# Patient Record
Sex: Female | Born: 1950 | Race: Black or African American | Hispanic: No | Marital: Married | Smoking: Never smoker
Health system: Southern US, Community
[De-identification: ages and names within clinical notes are randomized; demographics above are authoritative.]

## PROBLEM LIST (undated history)

## (undated) DIAGNOSIS — M1990 Unspecified osteoarthritis, unspecified site: Secondary | ICD-10-CM

## (undated) DIAGNOSIS — I2510 Atherosclerotic heart disease of native coronary artery without angina pectoris: Secondary | ICD-10-CM

## (undated) DIAGNOSIS — Q249 Congenital malformation of heart, unspecified: Secondary | ICD-10-CM

## (undated) DIAGNOSIS — Z87442 Personal history of urinary calculi: Secondary | ICD-10-CM

## (undated) DIAGNOSIS — I509 Heart failure, unspecified: Secondary | ICD-10-CM

## (undated) DIAGNOSIS — I10 Essential (primary) hypertension: Secondary | ICD-10-CM

## (undated) DIAGNOSIS — I639 Cerebral infarction, unspecified: Secondary | ICD-10-CM

## (undated) DIAGNOSIS — K5289 Other specified noninfective gastroenteritis and colitis: Secondary | ICD-10-CM

## (undated) DIAGNOSIS — K8689 Other specified diseases of pancreas: Secondary | ICD-10-CM

## (undated) DIAGNOSIS — J45909 Unspecified asthma, uncomplicated: Secondary | ICD-10-CM

## (undated) DIAGNOSIS — M129 Arthropathy, unspecified: Secondary | ICD-10-CM

## (undated) DIAGNOSIS — E119 Type 2 diabetes mellitus without complications: Secondary | ICD-10-CM

## (undated) HISTORY — DX: Personal history of urinary calculi: Z87.442

## (undated) HISTORY — DX: Unspecified asthma, uncomplicated: J45.909

## (undated) HISTORY — PX: TUBAL LIGATION: SHX77

## (undated) HISTORY — DX: Other specified diseases of pancreas: K86.89

## (undated) HISTORY — DX: Essential (primary) hypertension: I10

## (undated) HISTORY — PX: OTHER SURGICAL HISTORY: SHX169

## (undated) HISTORY — DX: Other specified noninfective gastroenteritis and colitis: K52.89

## (undated) HISTORY — DX: Arthropathy, unspecified: M12.9

## (undated) HISTORY — DX: Type 2 diabetes mellitus without complications: E11.9

## (undated) HISTORY — PX: KNEE ARTHROSCOPY: SUR90

---

## 2003-06-19 ENCOUNTER — Emergency Department (HOSPITAL_COMMUNITY): Admission: EM | Admit: 2003-06-19 | Discharge: 2003-06-20 | Payer: Self-pay | Admitting: Emergency Medicine

## 2004-05-21 ENCOUNTER — Emergency Department (HOSPITAL_COMMUNITY): Admission: EM | Admit: 2004-05-21 | Discharge: 2004-05-21 | Payer: Self-pay | Admitting: Emergency Medicine

## 2005-06-28 ENCOUNTER — Encounter: Admission: RE | Admit: 2005-06-28 | Discharge: 2005-06-28 | Payer: Self-pay | Admitting: *Deleted

## 2005-08-24 ENCOUNTER — Emergency Department (HOSPITAL_COMMUNITY): Admission: EM | Admit: 2005-08-24 | Discharge: 2005-08-24 | Payer: Self-pay | Admitting: Emergency Medicine

## 2005-12-24 ENCOUNTER — Ambulatory Visit: Payer: Self-pay | Admitting: Gastroenterology

## 2005-12-27 ENCOUNTER — Encounter: Payer: Self-pay | Admitting: Gastroenterology

## 2005-12-27 ENCOUNTER — Ambulatory Visit: Payer: Self-pay | Admitting: Gastroenterology

## 2006-01-10 ENCOUNTER — Ambulatory Visit: Payer: Self-pay | Admitting: Gastroenterology

## 2006-01-11 ENCOUNTER — Ambulatory Visit: Payer: Self-pay | Admitting: Cardiovascular Disease

## 2006-01-29 ENCOUNTER — Ambulatory Visit: Payer: Self-pay | Admitting: Gastroenterology

## 2006-02-20 ENCOUNTER — Ambulatory Visit: Payer: Self-pay | Admitting: Gastroenterology

## 2006-03-18 ENCOUNTER — Ambulatory Visit: Payer: Self-pay | Admitting: Gastroenterology

## 2006-03-21 ENCOUNTER — Encounter: Admission: RE | Admit: 2006-03-21 | Discharge: 2006-03-21 | Payer: Self-pay | Admitting: Gastroenterology

## 2006-03-29 ENCOUNTER — Ambulatory Visit: Payer: Self-pay | Admitting: Gastroenterology

## 2006-06-05 ENCOUNTER — Ambulatory Visit: Payer: Self-pay | Admitting: Gastroenterology

## 2006-06-05 LAB — CONVERTED CEMR LAB
ALT: 41 units/L — ABNORMAL HIGH (ref 0–40)
AST: 30 units/L (ref 0–37)
Albumin: 3.6 g/dL (ref 3.5–5.2)
BUN: 7 mg/dL (ref 6–23)
Basophils Relative: 0.7 % (ref 0.0–1.0)
Creatinine, Ser: 0.6 mg/dL (ref 0.4–1.2)
Eosinophils Relative: 9.4 % — ABNORMAL HIGH (ref 0.0–5.0)
GFR calc non Af Amer: 110 mL/min
Glucose, Bld: 111 mg/dL — ABNORMAL HIGH (ref 70–99)
INR: 1.1 (ref 0.9–2.0)
Lymphocytes Relative: 24 % (ref 12.0–46.0)
MCHC: 33.1 g/dL (ref 30.0–36.0)
MCV: 84.5 fL (ref 78.0–100.0)
Monocytes Absolute: 0.5 10*3/uL (ref 0.2–0.7)
Neutro Abs: 3.6 10*3/uL (ref 1.4–7.7)
Neutrophils Relative %: 58.4 % (ref 43.0–77.0)
RDW: 14.3 % (ref 11.5–14.6)
Total Protein: 7.6 g/dL (ref 6.0–8.3)

## 2006-06-06 ENCOUNTER — Ambulatory Visit: Payer: Self-pay | Admitting: Gastroenterology

## 2006-06-06 ENCOUNTER — Encounter (INDEPENDENT_AMBULATORY_CARE_PROVIDER_SITE_OTHER): Payer: Self-pay | Admitting: Specialist

## 2006-06-24 ENCOUNTER — Ambulatory Visit (HOSPITAL_COMMUNITY): Admission: RE | Admit: 2006-06-24 | Discharge: 2006-06-24 | Payer: Self-pay | Admitting: Gastroenterology

## 2006-07-08 ENCOUNTER — Ambulatory Visit: Payer: Self-pay | Admitting: Gastroenterology

## 2006-07-08 LAB — CONVERTED CEMR LAB: Hgb A1c MFr Bld: 7.3 % — ABNORMAL HIGH (ref 4.6–6.0)

## 2006-12-06 ENCOUNTER — Ambulatory Visit: Payer: Self-pay | Admitting: Gastroenterology

## 2007-07-07 DIAGNOSIS — J45909 Unspecified asthma, uncomplicated: Secondary | ICD-10-CM | POA: Insufficient documentation

## 2007-07-07 DIAGNOSIS — E119 Type 2 diabetes mellitus without complications: Secondary | ICD-10-CM | POA: Insufficient documentation

## 2007-07-07 DIAGNOSIS — Z87442 Personal history of urinary calculi: Secondary | ICD-10-CM | POA: Insufficient documentation

## 2007-07-07 DIAGNOSIS — M129 Arthropathy, unspecified: Secondary | ICD-10-CM | POA: Insufficient documentation

## 2007-07-07 DIAGNOSIS — I10 Essential (primary) hypertension: Secondary | ICD-10-CM | POA: Insufficient documentation

## 2007-07-07 DIAGNOSIS — K8689 Other specified diseases of pancreas: Secondary | ICD-10-CM | POA: Insufficient documentation

## 2007-11-13 ENCOUNTER — Telehealth: Payer: Self-pay | Admitting: Gastroenterology

## 2007-11-25 ENCOUNTER — Encounter: Payer: Self-pay | Admitting: Gastroenterology

## 2007-11-27 ENCOUNTER — Ambulatory Visit: Payer: Self-pay | Admitting: Gastroenterology

## 2008-05-21 ENCOUNTER — Encounter: Admission: RE | Admit: 2008-05-21 | Discharge: 2008-05-21 | Payer: Self-pay | Admitting: Internal Medicine

## 2008-12-07 ENCOUNTER — Telehealth: Payer: Self-pay | Admitting: Gastroenterology

## 2009-04-26 ENCOUNTER — Emergency Department (HOSPITAL_COMMUNITY): Admission: EM | Admit: 2009-04-26 | Discharge: 2009-04-26 | Payer: Self-pay | Admitting: Emergency Medicine

## 2009-06-20 ENCOUNTER — Encounter: Payer: Self-pay | Admitting: Gastroenterology

## 2009-06-21 ENCOUNTER — Telehealth: Payer: Self-pay | Admitting: Gastroenterology

## 2009-06-27 ENCOUNTER — Ambulatory Visit: Payer: Self-pay | Admitting: Gastroenterology

## 2009-06-27 DIAGNOSIS — R197 Diarrhea, unspecified: Secondary | ICD-10-CM | POA: Insufficient documentation

## 2010-04-30 ENCOUNTER — Encounter: Payer: Self-pay | Admitting: *Deleted

## 2010-05-11 NOTE — Progress Notes (Signed)
Summary: Needs OV   Phone Note Call from Patient Call back at [phone]   Caller: Patient Call For: Arlyce Dice Reason for Call: Talk to Nurse Summary of Call: Patient would like to be seen sooner than first available 4-12 do to diarrhea after each meals and per PCP pt having problems with her pancreas.  Initial call taken by: Tawni Levy,  June 21, 2009 9:34 AM  Follow-up for Phone Call        Pt. saw her PCP yesterday, was told to f/u with her GI MD. She has diarrhea 2-3 times weekly. Denies blood, black stools, fever, n/v.   1) See Dr.Amayrany Cafaro on 06-27-09 at 2:30pm 2) Have labs/note from PCP faxed over for above appt. 3) Immodium as needed for diarrhea. 4) Pt. instructed to call back as needed.  Follow-up by: Laureen Ochs LPN,  June 21, 2009 10:12 AM

## 2010-05-11 NOTE — Assessment & Plan Note (Signed)
Summary: DIARRHEA                Vanessa    History of Present Illness Visit Type: Follow-up Visit Primary GI MD: Melvia Heaps MD [facility] Primary Provider: Leodis Rains, MD Chief Complaint: diarrhea History of Present Illness:   Vanessa [name] has returned for reevaluation diarrhea.  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  On a regimen of Protonix and Ultrase her diarrhea has significantly improved.  Over the past month she has noted intermittent episodes of urgency with multiple loose stools.  The quality of her diarrhea is different from that which she has experienced in the past.  There has been no change in medications.  Her blood sugars have been poorly controlled as evidenced by an elevated hemoglobin A1c.  She denies melena or hematochezia.   GI Review of Systems      Denies abdominal pain, acid reflux, belching, bloating, chest pain, dysphagia with liquids, dysphagia with solids, heartburn, loss of appetite, nausea, vomiting, vomiting blood, weight loss, and  weight gain.      Reports diarrhea.     Denies anal fissure, black tarry stools, change in bowel habit, constipation, diverticulosis, fecal incontinence, heme positive stool, hemorrhoids, irritable bowel syndrome, jaundice, light color stool, liver problems, rectal bleeding, and  rectal pain. Preventive Screening-Counseling & Management  Alcohol-Tobacco     Smoking Status: quit      Drug Use:  no.      Current Medications (verified): 1)  Multivitamins   Tabs (Multiple Vitamin) .... One Tablet By Mouth Every Day 2)  Enalapril Maleate 20 Mg  Tabs (Enalapril Maleate) .... One Tablet By Mouth Every Day 3)  Hydrochlorothiazide 25 Mg  Tabs (Hydrochlorothiazide) .... One Tablet By Mouth Every Day 4)  Lipitor 10 Mg  Tabs (Atorvastatin Calcium) .... Take 1 Tablet By Mouth Once A Day 5)  Ultrase Mt 20 65-20-65 Mu  Cpep (Amylase-Lipase-Protease) .... Two Tablets With Every Meal 6)  Protonix 40 Mg  Tbec  (Pantoprazole Sodium) .... One Tablet Every Am Before Breakfast 7)  Lexapro 10 Mg  Tabs (Escitalopram Oxalate) .... One Tablet By Mouth Every Day 8)  Metformin Hcl 1000 Mg  Tabs (Metformin Hcl) .... One Tablet By Mouth Twice A Day 9)  Adult Aspirin Ec Low Strength 81 Mg  Tbec (Aspirin) .... Take 2 Tablets By Mouth Once Daily  Allergies (verified): 1)  ! Dilantin  Past History:  Past Medical History: Reviewed history from 07/07/2007 and no changes required. Current Problems:  COLITIS (ICD-558.9) NEPHROLITHIASIS, HX OF (ICD-V13.01) ARTHRITIS (ICD-716.90) ASTHMA (ICD-493.90) HYPERTENSION (ICD-401.9) DM (ICD-250.00) PANCREATIC INSUFFICIENCY (ICD-577.8)  Past Surgical History: Reviewed history from 11/27/2007 and no changes required. Tubal Ligation Knee Arthroscopy left arm fracture  Family History: Reviewed history from 11/27/2007 and no changes required. No FH of Colon Cancer: Family History of Diabetes: brother, sister, father, pat. aunt Family History of Heart Disease: Pat. Aunt  Social History: Reviewed history from 11/27/2007 and no changes required. Occupation: retired Married, 1 girl Patient is a former smoker.  Alcohol Use - no Illicit Drug Use - no Smoking Status:  quit  Review of Systems       The patient complains of fatigue.  The patient denies allergy/sinus, anemia, anxiety-new, arthritis/joint pain, back pain, blood in urine, breast changes/lumps, change in vision, confusion, cough, coughing up blood, depression-new, fainting, fever, headaches-new, hearing problems, heart murmur, heart rhythm changes, itching, muscle pains/cramps, night sweats, nosebleeds, shortness of breath, skin rash, sleeping problems, sore throat, swelling  of feet/legs, swollen lymph glands, thirst - excessive, urination - excessive, urination changes/pain, urine leakage, vision changes, and voice change.    Vital Signs:  Patient profile:   60 year old female Height:      63.5  inches Weight:      253.50 pounds BMI:     44.36 Pulse rate:   84 / minute Pulse rhythm:   regular BP sitting:   122 / 78  (left arm)  Vitals Entered By: Milford Cage NCMA (June 27, 2009 2:26 PM)  Physical Exam  Additional Exam:  On physical exam she is a well-developed well-nourished female  skin: anicteric HEENT: normocephalic; PEERLA; no nasal or pharyngeal abnormalities neck: supple nodes: no cervical lymphadenopathy chest: clear to ausculatation and percussion heart: no murmurs, gallops, or rubs abd: soft, nontender; BS normoactive; no abdominal masses, tenderness, organomegaly rectal: deferred ext: no cynanosis, clubbing, edema skeletal: no deformities neuro: oriented x 3; no focal abnormalities    Impression & Recommendations:  Problem # 1:  DIARRHEA (ICD-787.91) While she has maldigestion secondary to pancreatic insufficiency, this problem has been properly and successfully addressed.  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  Recommendations #1 trial of ampicillin 2050 mg q.i.d. for 7 days  Problem # 2:  DM (ICD-250.00) Assessment: Comment Only  Patient Instructions: 1)  We will send in your prescription to your pharmacy 2)  The medication list was reviewed and reconciled.  All changed / newly prescribed medications were explained.  A complete medication list was provided to the patient / caregiver. 3)  Please schedule a follow-up appointment in 3 weeks.  4)  cc Leodis Rains, MD Prescriptions: AMPICILLIN 250 MG/5ML SUSR (AMPICILLIN) take one tablet q.i.d.  #28 x 3   Entered and Authorized by:   Louis Meckel MD   Signed by:   Louis Meckel MD on 06/27/2009   Method used:   Electronically to        [facility]  [address] [id number] ([facility]       [address]       [place], [place]  [address]       Ph: [phone]       Fax: [phone]   RxID:    [id number]   Appended Document: DIARRHEA                Vanessa    Clinical Lists Changes  Medications: Rx of AMPICILLIN 250 MG/5ML SUSR (AMPICILLIN) take one tablet q.i.d.;  #28 x 3;  Signed;  Entered by: Merri Ray CMA (AAMA);  Authorized by: Louis Meckel MD;  Method used: Print then Give to Patient    Prescriptions: AMPICILLIN 250 MG/5ML SUSR (AMPICILLIN) take one tablet q.i.d.  #28 x 3   Entered by:   Merri Ray CMA (AAMA)   Authorized by:   Louis Meckel MD   Signed by:   Merri Ray CMA (AAMA) on 06/27/2009   Method used:   Print then Give to Patient   RxID:   [id number]

## 2010-06-26 LAB — URINALYSIS, ROUTINE W REFLEX MICROSCOPIC
Hgb urine dipstick: NEGATIVE
Ketones, ur: NEGATIVE mg/dL
Protein, ur: NEGATIVE mg/dL
Urobilinogen, UA: 1 mg/dL (ref 0.0–1.0)
pH: 6 (ref 5.0–8.0)

## 2010-06-26 LAB — DIFFERENTIAL
Basophils Absolute: 0 10*3/uL (ref 0.0–0.1)
Basophils Relative: 0 % (ref 0–1)
Eosinophils Absolute: 0.4 10*3/uL (ref 0.0–0.7)
Eosinophils Relative: 4 % (ref 0–5)
Neutrophils Relative %: 71 % (ref 43–77)

## 2010-06-26 LAB — GLUCOSE, CAPILLARY: Glucose-Capillary: 160 mg/dL — ABNORMAL HIGH (ref 70–99)

## 2010-06-26 LAB — COMPREHENSIVE METABOLIC PANEL
ALT: 28 U/L (ref 0–35)
AST: 25 U/L (ref 0–37)
Albumin: 3.8 g/dL (ref 3.5–5.2)
Alkaline Phosphatase: 102 U/L (ref 39–117)
CO2: 31 mEq/L (ref 19–32)
Calcium: 9.7 mg/dL (ref 8.4–10.5)
Chloride: 100 mEq/L (ref 96–112)
Creatinine, Ser: 0.74 mg/dL (ref 0.4–1.2)
GFR calc Af Amer: >60 mL/min (ref >60)
GFR calc non Af Amer: >60 mL/min (ref >60)
Glucose, Bld: 142 mg/dL — ABNORMAL HIGH (ref 70–99)
Potassium: 3.8 mEq/L (ref 3.5–5.1)
Total Protein: 7.7 g/dL (ref 6.0–8.3)

## 2010-06-26 LAB — CBC
HCT: 37.4 % (ref 36.0–46.0)
Hemoglobin: 12 g/dL (ref 12.0–15.0)
MCV: 84.7 fL (ref 78.0–100.0)
WBC: 8.7 10*3/uL (ref 4.0–10.5)

## 2010-06-26 LAB — ACETAMINOPHEN LEVEL: Acetaminophen (Tylenol), Serum: <10 ug/mL — ABNORMAL LOW (ref 10–30)

## 2010-06-26 LAB — URINE MICROSCOPIC-ADD ON

## 2010-08-22 NOTE — Assessment & Plan Note (Signed)
[facility]                         GASTROENTEROLOGY OFFICE NOTE   NAME:Vanessa [name], Vanessa [name]                      MRN:          [id number]  DATE:12/06/2006                            DOB:          [birth year]    PROBLEM:  Pancreatic insufficiency.   HISTORY OF PRESENT ILLNESS:  Ms. Rottinghaus has returned for scheduled  followup. On the regimen of pancreatic enzyme supplementation and  Protonix, she is doing extremely well. She has pancreatic insufficiency  of unclear etiology. Because of the slowly elevated glucose, a  hemoglobin A1C was drawn and that was elevated to 7.3. She was placed on  Metformin. If she does have a particularly greasy or fatty meal, she  develops some grumbling, which subsides if she takes an extra enzyme  tablet.   PHYSICAL EXAMINATION:  VITAL SIGNS:  Pulse 80, blood pressure 120/76,  weight 242.   IMPRESSION:  1. Pancreatic insufficiency of unclear etiology.  2. Diabetes.   RECOMMENDATIONS:  Continue current regimen.     Barbette Hair. Arlyce Dice, MD,FACG  Electronically Signed    RDK/MedQ  DD: 12/06/2006  DT: 12/07/2006  Job #: [id number]

## 2010-08-25 NOTE — Assessment & Plan Note (Signed)
[facility]                         GASTROENTEROLOGY OFFICE NOTE   NAME:Vanessa [name], Vanessa [name]                      MRN:          [id number]  DATE:06/05/2006                            DOB:          February 26, 1951    PROBLEM:  Diarrhea.   REASON:  Vanessa [name] has returned for reevaluation.  She was seen at  [facility].  It was felt that she has severe IBS.  She was told to stop  her pancreatic enzyme supplements because of diarrhea.  She stopped this  2-1/2 weeks ago and has developed severe diarrhea with 5 to 6 stools a  day.  She is complaining of fatigue.  She has greasy stools.   EXAMINATION:  Pulse 64, blood pressure 120/68, weight 244.  ABDOMEN:  Is mildly diffusely tender without guarding or rebound. There  are no abdominal masses or organomegaly.   IMPRESSION:  Severe diarrhea.  It appears that she has been  malabsorbing, as evidenced by her greasy stools.  Pancreatic  insufficiency is certainly a possibility, though she has no risk factors  for developing pancreatic insufficiency.  Probably malabsorption, small  bowel malabsorption is a concern, though her celiac markers were  negative.  I question the diagnosis of irritable bowel syndrome in the  face of positive fecal fat.   RECOMMENDATIONS:  1. Repeat CBC, carotene level and CMET.  2. Resume pancreatic enzymes.  3. Small bowel biopsy.  4. Repeat stool [place] stain.  5. To consider MRCP if appears to be pancreatic insufficiency to      determine whether she has any ductal abnormalities.     Barbette Hair. Arlyce Dice, MD,FACG  Electronically Signed    RDK/MedQ  DD: 06/05/2006  DT: 06/05/2006  Job #: [id number]   cc:   Danella Deis, MD

## 2010-08-25 NOTE — Assessment & Plan Note (Signed)
[facility]                         GASTROENTEROLOGY OFFICE NOTE   NAME:Hehr, Vanessa [name]                      MRN:          [id number]  DATE:03/18/2006                            DOB:          25-Jun-1950    PROBLEM:  Diarrhea.   REASON:  Vanessa [name] has returned for re-evaluation.  She initially  improved on Ultrase supplementation.  She subsequently has developed  recurrent multiple stools.  Stools are solid, though she may have at  least 3-4 a day.  She has the sensation of tenesmus and then will follow  by passing more stool.  Weight has been stable.  She has been taking  Ultrase two with each meal.  She remains on Lialda.   PHYSICAL EXAMINATION:  Pulse 80, blood pressure 122/72, weight 238.   IMPRESSION:  Persistent hyper-stooling with what the patient describes  as grease.  It is not clear what the etiology for this is.  Presumably,  she has some malabsorption or maldigestion.   RECOMMENDATION:  1. Add Protonix 40 mg a day to her Ultrase.  2. DC Lialda.  3. CT enteroscopy.  4. If patient is not improved, I will refer her to a tertiary care      center for another opinion.     Barbette Hair. Arlyce Dice, MD,FACG  Electronically Signed    RDK/MedQ  DD: 03/18/2006  DT: 03/19/2006  Job #: [id number]   cc:   Leodis Rains

## 2010-08-25 NOTE — Assessment & Plan Note (Signed)
[facility]                           GASTROENTEROLOGY OFFICE NOTE   NAME:Vanessa [name], Vanessa [name]                      MRN:          [id number]  DATE:01/10/2006                            DOB:          1950-10-05    PROBLEM:  Diarrhea.   Vanessa [name] has returned for scheduled GI follow-up.  Colonoscopy  demonstrated what appeared to be some inflammatory changes in the left colon  consisting of edema and a few areas of granularity.  Biopsies, however, did  not demonstrate any inflammatory changes.  She has been taking Lialda 4.8 g  a day without improvement.  She continues to have multiple bowel movements  during the day and at night.  She feels weak.  She has lost three more  pounds.  The daughter has expressed keen concern about her weight loss and  there is a question of obtaining second opinion.   PHYSICAL EXAMINATION:  VITAL SIGNS:  Pulse 72, blood pressure 138/80, weight  238.   IMPRESSION:  A several month history of diarrhea.  There has been no change  in medications.  Hyperthyroidism should be ruled out.  Chronic infection is  unlikely.  This should be ruled out as well.   RECOMMENDATIONS:  1. Check stools for C. difficile toxin, O&P and C&S and leukocytes.  2. Check a thyroid function tests.  3. CT of the abdomen and pelvis.   I carefully explained to Vanessa [name] and her daughter that they are free  to get a second opinion which I will facilitate.  She does not have a  diagnosis as yet but I explained that work-up will continue.  She is  instructed to continue her Lialda.       Barbette Hair. Arlyce Dice, MD,FACG      RDK/MedQ  DD:  01/10/2006  DT:  01/11/2006  Job #:  [id number]   cc:   Leodis Rains, MD

## 2010-08-25 NOTE — Assessment & Plan Note (Signed)
[facility]                           GASTROENTEROLOGY OFFICE NOTE   NAME:Vanessa [name], Vanessa [name]                      MRN:          [id number]  DATE:02/20/2006                            DOB:          03-29-1951    PROBLEM:  Diarrhea.   Ms. Traore has returned for scheduled followup. Lab work including stools  for C&S, O&P, leukocytes and C Dif toxin were negative. CT of the abdomen  and pelvis were also unremarkable. While a stool _________  was negative, a  random of quantitative fecal fat was positive at 40 with normal being less  than 20%. Betacarotene level was normal as was a celiac panel. Ms. Summerfield  continues to complain of 4-5 stools daily that are greasy.   PHYSICAL EXAMINATION:  Pulse 60, blood pressure 110/76, weight 235.   IMPRESSION:  Diarrhea. She appears to be malabsorbing or maldigesting. There  is no evidence of underlying pancreatic disease, however.   RECOMMENDATIONS:  A trial of pancreatic enzyme supplementation. If this is  negative, I will obtain small bowel biopsies.     Barbette Hair. Arlyce Dice, MD,FACG  Electronically Signed    RDK/MedQ  DD: 02/20/2006  DT: 02/20/2006  Job #: [id number]   cc:   Leodis Rains, MD

## 2010-08-25 NOTE — Assessment & Plan Note (Signed)
[name]                                   ON-CALL NOTE   NAME:[name], [name]                      MRN:          [id number]  DATE:12/27/2005                            DOB:          1950/09/17    CALLER:  Vanessa [name], the patient's daughter.   DATE OF THE CALL:  December 27, 2005, 5:21 p.m.   REASON FOR THE CALL:  Medication question.   TELEPHONE CONVERSATION:  Patient's daughter called today stating that her  mother had had a colonoscopy with Dr. Arlyce Dice earlier today.  She was  prescribed Lialda for newly diagnosed colitis.  She had been having problems  with diarrhea.  However, the cost of the prescription was found to be  prohibitive and they inquired regarding therapeutic substitution.  I  informed them that they can discuss this with Dr. Arlyce Dice tomorrow during  office hours to have him provide more cost effective alternative therapy, as  she is in no acute distress short of her chronic diarrhea.  They did inquire  if there was anything she could take tonight for diarrhea if it were to  become problematic.  I told them that over-the-counter Imodium would be fine  in limited doses, as directed, until they speak to Dr. Arlyce Dice.                                   [name], MD   JNP/MedQ  DD:  12/27/2005  DT:  12/29/2005  Job #:  [id number]   cc:   Barbette Hair. Arlyce Dice, MD,FACG

## 2010-08-25 NOTE — Assessment & Plan Note (Signed)
[facility]                         GASTROENTEROLOGY OFFICE NOTE   NAME:[name], Vanessa WELLIVER                      MRN:          [id number]  DATE:03/29/2006                            DOB:          [birth year]    PROBLEM:  Diarrhea.  [name] has returned for scheduled followup.  On a regimen of Protonix and Altace, the latter 2 tabs with each meal.  Her diarrhea has entirely subsided.  She complains of slight bloating  only.  Stools are solid.  She is no longer passing grease.  Weight is  stable.   Pulse 68, blood pressure 102/64, weight 242.   IMPRESSION:  Diarrhea with a positive fecal fat.  Presumably, this is  due to a pancreatic insufficiency.  Celiac panel is negative.  Recent  small bowel CT enterography was normal.  CT scan of the abdomen and  pelvis was unremarkable as well.  I believe that she probably is now  digesting, I am not certain why she would have pancreatic insufficiency.  There is no history of alcohol abuse or chronic pancreatitis, but she is  responding to pancreatic enzymes supplementation.   RECOMMENDATIONS:  1. Continue current regimen.  2. Patient wishes to obtain a second opinion, and she will be seen at      [facility].     Barbette Hair. Arlyce Dice, MD,FACG  Electronically Signed    RDK/MedQ  DD: 03/29/2006  DT: 03/30/2006  Job #: [id number]   cc:   Dr. Danella Deis

## 2010-08-25 NOTE — Assessment & Plan Note (Signed)
[facility]                         GASTROENTEROLOGY OFFICE NOTE   NAME:Vanessa [name]                      MRN:          [id number]  DATE:07/08/2006                            DOB:          12/31/1950    PROBLEM:  Malabsorption.   REASON:  [name] has returned for a scheduled followup.  She has  pancreatic insufficiency determined by a malabsorption workup.  Her  carotene level was low.  Small bowel biopsy was negative for sprue.  Off  pancreatic enzymes she continued to have excess free fatty acids.   She is back on enzyme replacement and diarrhea has subsided.  She does  have about 3 bowel movements a day, though they are solid and without  urgency.  She is no longer passing grease and her energy level is  significantly improved.  An MRCP demonstrated no ductal abnormalities  and some mild pancreatic atrophy.   On exam pulse 64, blood pressure 110/68, weight 253.   IMPRESSION:  Pancreatic insufficiency of unclear etiology.  She has  responded well to enzyme supplementation, combined with proton pump  inhibitor therapy.   RECOMMENDATIONS:  1. Continue current medications.  2. Check hemoglobin A1C to rule out concurrent diabetes (minimal      elevations of  her glucose in the past).     Barbette Hair. Arlyce Dice, MD,FACG  Electronically Signed    RDK/MedQ  DD: 07/08/2006  DT: 07/08/2006  Job #: [id number]

## 2010-08-25 NOTE — Assessment & Plan Note (Signed)
[facility]                           GASTROENTEROLOGY OFFICE NOTE   NAME:[name], Vanessa ALFIERI                      MRN:          [id number]  DATE:12/24/2005                            DOB:          1950-05-21    REASON FOR CONSULTATION:  Diarrhea.   Vanessa [name] is a pleasant 60 year old African American female referred  through the courtesy of Dr. Chaney Born for evaluation. Over the last  three months, she has been complaining of significant change of bowel  habits.  She is having up to 10 bowel movements a day consisting of loose  watery stools.  They are accompanied by urgency and occasional incontinence.  She denies abdominal pain.  She occasionally wakens with pain with the urge  to defecate.  There has been no change in her diet or medications.  She has  been on no antibiotics.  She has lost 28 pounds since March 2007.   PAST MEDICAL HISTORY:  1. Hypertension.  2. Asthma.  3. Arthritis.  4. History of kidney stones.  5. Status post tubal ligation.   FAMILY HISTORY:  Noncontributory.   MEDICATIONS:  1. Enalapril.  2. Hydrochlorothiazide.   ALLERGIES:  She is allergic to DILANTIN.   She neither smokes nor drinks.  She is married and retired.   REVIEW OF SYSTEMS:  Positive for joint and back pain.   PHYSICAL EXAMINATION:  GENERAL APPEARANCE:  She is a healthy-appearing  female.  VITAL SIGNS:  Pulse 84, blood pressure 130/82, weight 241.  HEENT:  EOMI. PERRLA. Sclerae are anicteric.  Conjunctivae are pink.  NECK:  Supple without thyromegaly, adenopathy or carotid bruits.  CHEST:  Clear to auscultation and percussion without adventitious sounds.  CARDIAC:  Regular rhythm; normal S1 S2.  There are no murmurs, gallops or  rubs.  ABDOMEN:  Bowel sounds are normoactive.  Abdomen is soft, non-tender and non-  distended.  There are no abdominal masses, tenderness, splenic enlargement  or hepatomegaly.  EXTREMITIES:  Full range of motion.   No cyanosis, clubbing or edema.  RECTAL:  Examination deferred.   IMPRESSION:  Change of bowel habits with new onset diarrhea and weight loss.  Obstructive lesion of the colon should be ruled out including neoplasm and  inflammatory bowel disease.  Infection is less likely.  Medications are also  not likely in the absence of any change.   RECOMMENDATION:  1. CBC, CMET and sed rate.  2. Colonoscopy.                                   Barbette Hair. Arlyce Dice, MD,FACG   RDK/MedQ  DD:  12/24/2005  DT:  12/25/2005  Job #:  [id number]   cc:   Dr. Chaney Born

## 2010-12-27 ENCOUNTER — Encounter: Payer: Self-pay | Admitting: Gastroenterology

## 2010-12-27 ENCOUNTER — Other Ambulatory Visit (INDEPENDENT_AMBULATORY_CARE_PROVIDER_SITE_OTHER)

## 2010-12-27 ENCOUNTER — Ambulatory Visit (INDEPENDENT_AMBULATORY_CARE_PROVIDER_SITE_OTHER): Admitting: Gastroenterology

## 2010-12-27 VITALS — BP 138/80 | HR 73 | Ht 61.0 in | Wt 226.0 lb

## 2010-12-27 DIAGNOSIS — R634 Abnormal weight loss: Secondary | ICD-10-CM

## 2010-12-27 DIAGNOSIS — K8689 Other specified diseases of pancreas: Secondary | ICD-10-CM

## 2010-12-27 DIAGNOSIS — R110 Nausea: Secondary | ICD-10-CM

## 2010-12-27 LAB — CBC WITH DIFFERENTIAL/PLATELET
Basophils Absolute: 0 10*3/uL (ref 0.0–0.1)
Eosinophils Absolute: 0.3 10*3/uL (ref 0.0–0.7)
HCT: 37.9 % (ref 36.0–46.0)
Hemoglobin: 12.4 g/dL (ref 12.0–15.0)
Lymphs Abs: 1.8 10*3/uL (ref 0.7–4.0)
MCHC: 32.7 g/dL (ref 30.0–36.0)
MCV: 84.6 fl (ref 78.0–100.0)
Monocytes Absolute: 0.5 10*3/uL (ref 0.1–1.0)
Neutro Abs: 4.5 10*3/uL (ref 1.4–7.7)
Platelets: 515 10*3/uL — ABNORMAL HIGH (ref 150.0–400.0)
RDW: 15.2 % — ABNORMAL HIGH (ref 11.5–14.6)

## 2010-12-27 LAB — COMPREHENSIVE METABOLIC PANEL
AST: 23 U/L (ref 0–37)
Albumin: 4 g/dL (ref 3.5–5.2)
Alkaline Phosphatase: 88 U/L (ref 39–117)
BUN: 12 mg/dL (ref 6–23)
Creatinine, Ser: 0.6 mg/dL (ref 0.4–1.2)
Glucose, Bld: 149 mg/dL — ABNORMAL HIGH (ref 70–99)
Potassium: 3.9 mEq/L (ref 3.5–5.1)
Total Bilirubin: 0.4 mg/dL (ref 0.3–1.2)

## 2010-12-27 LAB — PROTIME-INR: Prothrombin Time: 12.2 s (ref 10.2–12.4)

## 2010-12-27 NOTE — Progress Notes (Signed)
History of Present Illness:  Ms. Pardue has returned for evaluation of weight loss. She has a history of diabetes and idiopathic pancreatic insufficiency, Colonoscopy in 2007 showed mild erythema but biopsies were negative. Upper endoscopy in 2008 was normal. Biopsies were negative for celiac disease.  She recently has had a 50 pound weight loss and changes in her insulin and oral hypoglycemics because of difficulty with glucose control. Bowels are fairly stable. She denies oily or loose stools. Family is concerned that she may have worsening pancreatic insufficiency. She  complain of abdominal bloating and occasional nausea.    Review of Systems: Pertinent positive and negative review of systems were noted in the above HPI section. All other review of systems were otherwise negative.    Current Medications, Allergies, Past Medical History, Past Surgical History, Family History and Social History were reviewed in [employer] electronic medical record  Vital signs were reviewed in today's medical record. Physical Exam: General: Well developed , well nourished, no acute distress Head: Normocephalic and atraumatic Eyes:  sclerae anicteric, EOMI Ears: Normal auditory acuity Mouth: No deformity or lesions Lungs: Clear throughout to auscultation Heart: Regular rate and rhythm; no murmurs, rubs or bruits Abdomen: Soft, non tender and non distended. No masses, hepatosplenomegaly or hernias noted. Normal Bowel sounds Rectal:deferred Musculoskeletal: Symmetrical with no gross deformities  Pulses:  Normal pulses noted Extremities: No clubbing, cyanosis, edema or deformities noted Neurological: Alert oriented x 4, grossly nonfocal Psychological:  Alert and cooperative. Normal mood and affect

## 2010-12-27 NOTE — Assessment & Plan Note (Addendum)
Change in insulin requirements and weight loss raises the question of worsening pancreatic endocrine insufficiency. There is no clinical evidence for worsening pancreatic exocrine insufficiency.  Recommendations #1 check CBC, INR, complete metabolic profile and carotene levels

## 2010-12-27 NOTE — Assessment & Plan Note (Signed)
Nausea and bloating could be secondary to gastroparesis.  Recommendations #1 gastric emptying scan

## 2010-12-27 NOTE — Patient Instructions (Signed)
Go to the basement for labs today Your GES is scheduled on 01/16/2011 at 8am to arrive at 7:45am at [facility] You will not take your Protonix 24 hours prior to your test

## 2010-12-31 LAB — CAROTENE, SERUM: Carotene, Total-Serum: 8 ug/dL (ref 6–77)

## 2011-01-16 ENCOUNTER — Telehealth: Payer: Self-pay | Admitting: Gastroenterology

## 2011-01-16 ENCOUNTER — Encounter (HOSPITAL_COMMUNITY)
Admission: RE | Admit: 2011-01-16 | Discharge: 2011-01-16 | Disposition: A | Discharge status: Home / Self Care | Source: Ambulatory Visit | Attending: Gastroenterology | Admitting: Gastroenterology

## 2011-01-16 DIAGNOSIS — R634 Abnormal weight loss: Secondary | ICD-10-CM | POA: Insufficient documentation

## 2011-01-16 MED ORDER — TECHNETIUM TC 99M SULFUR COLLOID
2.0000 | Freq: Once | INTRAVENOUS | Status: AC | PRN
  Administered 2011-01-16: 2 via ORAL

## 2011-01-16 NOTE — Telephone Encounter (Signed)
Lab results reviewed with pt.

## 2011-01-17 ENCOUNTER — Other Ambulatory Visit (HOSPITAL_COMMUNITY)

## 2011-01-18 ENCOUNTER — Telehealth: Payer: Self-pay

## 2011-01-18 NOTE — Telephone Encounter (Signed)
Pt aware.

## 2011-01-18 NOTE — Progress Notes (Signed)
Quick Note:  Please inform the patient that her GESwas normal and to continue current plan of action ______

## 2011-01-18 NOTE — Telephone Encounter (Signed)
Message copied by Michele Mcalpine on Thu Jan 18, 2011  9:17 AM ------      Message from: Melvia Heaps D      Created: Thu Jan 18, 2011  8:35 AM       Please inform the patient that her GESwas normal and to continue current plan of action

## 2011-02-14 ENCOUNTER — Other Ambulatory Visit: Payer: Self-pay

## 2011-02-14 ENCOUNTER — Encounter (HOSPITAL_COMMUNITY): Payer: Self-pay | Admitting: Student

## 2011-02-14 ENCOUNTER — Emergency Department (HOSPITAL_COMMUNITY)
Admission: EM | Admit: 2011-02-14 | Discharge: 2011-02-14 | Disposition: A | Discharge status: Home / Self Care | Attending: Emergency Medicine | Admitting: Emergency Medicine

## 2011-02-14 DIAGNOSIS — E119 Type 2 diabetes mellitus without complications: Secondary | ICD-10-CM | POA: Insufficient documentation

## 2011-02-14 DIAGNOSIS — R531 Weakness: Secondary | ICD-10-CM

## 2011-02-14 DIAGNOSIS — R5381 Other malaise: Secondary | ICD-10-CM | POA: Insufficient documentation

## 2011-02-14 DIAGNOSIS — Z794 Long term (current) use of insulin: Secondary | ICD-10-CM | POA: Insufficient documentation

## 2011-02-14 DIAGNOSIS — I10 Essential (primary) hypertension: Secondary | ICD-10-CM | POA: Insufficient documentation

## 2011-02-14 DIAGNOSIS — R5383 Other fatigue: Secondary | ICD-10-CM | POA: Insufficient documentation

## 2011-02-14 LAB — BASIC METABOLIC PANEL
BUN: 13 mg/dL (ref 6–23)
Chloride: 98 mEq/L (ref 96–112)
Creatinine, Ser: 0.6 mg/dL (ref 0.50–1.10)
GFR calc Af Amer: >90 mL/min (ref >90)
GFR calc non Af Amer: >90 mL/min (ref >90)
Potassium: 4.1 mEq/L (ref 3.5–5.1)

## 2011-02-14 LAB — CBC
HCT: 39.5 % (ref 36.0–46.0)
MCHC: 32.9 g/dL (ref 30.0–36.0)
MCV: 83 fL (ref 78.0–100.0)
Platelets: 507 10*3/uL — ABNORMAL HIGH (ref 150–400)
RDW: 15.1 % (ref 11.5–15.5)
WBC: 8 10*3/uL (ref 4.0–10.5)

## 2011-02-14 LAB — URINALYSIS, ROUTINE W REFLEX MICROSCOPIC
Bilirubin Urine: NEGATIVE
Hgb urine dipstick: NEGATIVE
Ketones, ur: NEGATIVE mg/dL
Nitrite: NEGATIVE
Protein, ur: NEGATIVE mg/dL
Specific Gravity, Urine: 1.021 (ref 1.005–1.030)
Urobilinogen, UA: 1 mg/dL (ref 0.0–1.0)

## 2011-02-14 LAB — GLUCOSE, CAPILLARY: Glucose-Capillary: 117 mg/dL — ABNORMAL HIGH (ref 70–99)

## 2011-02-14 LAB — TROPONIN I: Troponin I: <0.3 ng/mL (ref <0.30)

## 2011-02-14 MED ORDER — ZOLPIDEM TARTRATE 5 MG PO TABS: 5.0000 mg | ORAL_TABLET | Freq: Every evening | ORAL | Status: DC | PRN

## 2011-02-14 NOTE — ED Provider Notes (Signed)
History     CSN: [id number] Arrival date & time: 02/14/2011 11:52 AM   First MD Initiated Contact with Patient 02/14/11 1302      Chief Complaint  Patient presents with  . Hyperglycemia    (Consider location/radiation/quality/duration/timing/severity/associated sxs/prior treatment) HPI  Past Medical History  Diagnosis Date  . Other and unspecified noninfectious gastroenteritis and colitis   . Personal history of urinary calculi   . Arthropathy, unspecified, site unspecified   . Unspecified asthma   . Unspecified essential hypertension   . Type II or unspecified type diabetes mellitus without mention of complication, not stated as uncontrolled   . Other specified disease of pancreas     Past Surgical History  Procedure Date  . Tubal ligation   . Knee arthroscopy   . Left arm fracture     Family History  Problem Relation Age of Onset  . Diabetes Father     multiple family members  . Heart disease Father     brother, paternal aunt  . Colon cancer Neg Hx   . Lung cancer Father   . Leukemia Mother     History  Substance Use Topics  . Smoking status: Never Smoker   . Smokeless tobacco: Never Used  . Alcohol Use: No    OB History    Grav Para Term Preterm Abortions TAB SAB Ect Mult Living                  Review of Systems  Allergies  Phenytoin  Home Medications   Current Outpatient Rx  Name Route Sig Dispense Refill  . AMYLASE-LIPASE-PROTEASE 65-20-65 MU PO CPEP Oral Take 1 capsule by mouth 2 (two) times daily with a meal.     . ASPIRIN 81 MG PO TABS Oral Take 81 mg by mouth daily.     . ENALAPRIL MALEATE 20 MG PO TABS Oral Take 20 mg by mouth daily.     Marland Kitchen ESCITALOPRAM OXALATE 10 MG PO TABS Oral Take 10 mg by mouth daily.     Marland Kitchen EVENING PRIMROSE OIL 1000 MG PO CAPS Oral Take 1 capsule by mouth daily.      Marland Kitchen HYDROCHLOROTHIAZIDE 25 MG PO TABS Oral Take 25 mg by mouth daily.     . INSULIN REGULAR HUMAN 100 UNIT/ML IJ SOLN Subcutaneous Inject 30 Units  into the skin daily.     Marland Kitchen METFORMIN HCL 1000 MG PO TABS Oral Take 1,000 mg by mouth 2 (two) times daily with a meal.     . MULTIVITAMINS PO CAPS Oral Take 1 capsule by mouth daily.     Marland Kitchen OVER THE COUNTER MEDICATION Oral Take 1 tablet by mouth daily. DIABETIC VITAMIN PACK ([facility] BRAND)     . PANTOPRAZOLE SODIUM 40 MG PO TBEC Oral Take 40 mg by mouth daily.     Marland Kitchen AMPICILLIN 250 MG PO CAPS Oral Take 250 mg by mouth 4 (four) times daily.     . ATORVASTATIN CALCIUM 10 MG PO TABS Oral Take 10 mg by mouth daily.      Marland Kitchen ZOLPIDEM TARTRATE 5 MG PO TABS Oral Take 1 tablet (5 mg total) by mouth at bedtime as needed for sleep. 10 tablet 0    BP 132/65  Pulse 80  Temp(Src) 98.6 F (37 C) (Oral)  Resp 20  Wt 224 lb (101.606 kg)  SpO2 100%  Physical Exam  ED Course  Procedures (including critical care time)  Labs Reviewed  GLUCOSE, CAPILLARY - Abnormal; Notable for  the following:    Glucose-Capillary 117 (*)    All other components within normal limits  CBC - Abnormal; Notable for the following:    Platelets 507 (*)    All other components within normal limits  BASIC METABOLIC PANEL - Abnormal; Notable for the following:    Glucose, Bld 111 (*)    All other components within normal limits  TROPONIN I  URINALYSIS, ROUTINE W REFLEX MICROSCOPIC  POCT CBG MONITORING   No results found.   1. Weakness       MDM  Well appearing. Generalized weakness. No specific complaint.         Lyanne Co, MD 02/14/11 1640

## 2011-02-14 NOTE — ED Notes (Signed)
Pt in with c/o hyperglycemia this morning. Reports prior hx of intermittent blood sugar highs and lows. Reports taking 1000 mg metformin and 30 unit of Regular insulin this morning. At home, CBG in 180 range.

## 2011-02-14 NOTE — ED Notes (Signed)
Pt gone to the restroom at this time. Will get labs when pt returns.

## 2011-02-19 ENCOUNTER — Emergency Department (HOSPITAL_COMMUNITY)

## 2011-02-19 ENCOUNTER — Encounter (HOSPITAL_COMMUNITY): Payer: Self-pay | Admitting: *Deleted

## 2011-02-19 ENCOUNTER — Other Ambulatory Visit: Payer: Self-pay

## 2011-02-19 ENCOUNTER — Inpatient Hospital Stay (HOSPITAL_COMMUNITY)
Admission: EM | Admit: 2011-02-19 | Discharge: 2011-02-22 | DRG: 066 | Disposition: A | Discharge status: Home Health | Attending: Internal Medicine | Admitting: Internal Medicine

## 2011-02-19 DIAGNOSIS — Z794 Long term (current) use of insulin: Secondary | ICD-10-CM

## 2011-02-19 DIAGNOSIS — I6350 Cerebral infarction due to unspecified occlusion or stenosis of unspecified cerebral artery: Principal | ICD-10-CM | POA: Diagnosis present

## 2011-02-19 DIAGNOSIS — R531 Weakness: Secondary | ICD-10-CM

## 2011-02-19 DIAGNOSIS — I10 Essential (primary) hypertension: Secondary | ICD-10-CM | POA: Diagnosis present

## 2011-02-19 DIAGNOSIS — E119 Type 2 diabetes mellitus without complications: Secondary | ICD-10-CM

## 2011-02-19 DIAGNOSIS — IMO0001 Reserved for inherently not codable concepts without codable children: Secondary | ICD-10-CM | POA: Diagnosis present

## 2011-02-19 DIAGNOSIS — M129 Arthropathy, unspecified: Secondary | ICD-10-CM

## 2011-02-19 DIAGNOSIS — R5381 Other malaise: Secondary | ICD-10-CM | POA: Diagnosis present

## 2011-02-19 DIAGNOSIS — E785 Hyperlipidemia, unspecified: Secondary | ICD-10-CM | POA: Diagnosis present

## 2011-02-19 DIAGNOSIS — R5383 Other fatigue: Secondary | ICD-10-CM | POA: Diagnosis present

## 2011-02-19 LAB — DIFFERENTIAL
Basophils Absolute: 0 10*3/uL (ref 0.0–0.1)
Basophils Relative: 0 % (ref 0–1)
Eosinophils Absolute: 0.5 10*3/uL (ref 0.0–0.7)
Eosinophils Relative: 5 % (ref 0–5)
Monocytes Absolute: 0.5 10*3/uL (ref 0.1–1.0)
Monocytes Relative: 5 % (ref 3–12)
Neutro Abs: 6 10*3/uL (ref 1.7–7.7)

## 2011-02-19 LAB — COMPREHENSIVE METABOLIC PANEL
Albumin: 3.8 g/dL (ref 3.5–5.2)
BUN: 10 mg/dL (ref 6–23)
Calcium: 10 mg/dL (ref 8.4–10.5)
Chloride: 97 mEq/L (ref 96–112)
Creatinine, Ser: 0.58 mg/dL (ref 0.50–1.10)
Total Bilirubin: 0.1 mg/dL — ABNORMAL LOW (ref 0.3–1.2)

## 2011-02-19 LAB — LIPASE, BLOOD: Lipase: 7 U/L — ABNORMAL LOW (ref 11–59)

## 2011-02-19 LAB — URINALYSIS, ROUTINE W REFLEX MICROSCOPIC
Ketones, ur: NEGATIVE mg/dL
Nitrite: NEGATIVE
Protein, ur: NEGATIVE mg/dL
pH: 7 (ref 5.0–8.0)

## 2011-02-19 LAB — CBC
HCT: 39.7 % (ref 36.0–46.0)
Hemoglobin: 12.9 g/dL (ref 12.0–15.0)
MCH: 27 pg (ref 26.0–34.0)
MCHC: 32.5 g/dL (ref 30.0–36.0)
MCV: 83.1 fL (ref 78.0–100.0)
RDW: 15.1 % (ref 11.5–15.5)

## 2011-02-19 LAB — TROPONIN I: Troponin I: <0.3 ng/mL (ref <0.30)

## 2011-02-19 LAB — URINE MICROSCOPIC-ADD ON

## 2011-02-19 MED ORDER — ACETAMINOPHEN 325 MG PO TABS
650.0000 mg | ORAL_TABLET | Freq: Four times a day (QID) | ORAL | Status: DC | PRN
  Administered 2011-02-20 – 2011-02-21 (×2): 650 mg via ORAL
  Filled 2011-02-19 (×2): qty 2

## 2011-02-19 MED ORDER — ASPIRIN EC 81 MG PO TBEC
162.0000 mg | DELAYED_RELEASE_TABLET | Freq: Every day | ORAL | Status: DC
  Administered 2011-02-20 – 2011-02-22 (×3): 162 mg via ORAL
  Filled 2011-02-19 (×4): qty 2

## 2011-02-19 MED ORDER — ALUM & MAG HYDROXIDE-SIMETH 200-200-20 MG/5ML PO SUSP
30.0000 mL | Freq: Four times a day (QID) | ORAL | Status: DC | PRN
Start: 2011-02-19 — End: 2011-02-22

## 2011-02-19 MED ORDER — ONDANSETRON HCL 4 MG/2ML IJ SOLN: 4.0000 mg | Freq: Four times a day (QID) | INTRAMUSCULAR | Status: DC | PRN

## 2011-02-19 MED ORDER — ENOXAPARIN SODIUM 40 MG/0.4ML ~~LOC~~ SOLN
40.0000 mg | SUBCUTANEOUS | Status: DC
  Administered 2011-02-19: 40 mg via SUBCUTANEOUS
  Filled 2011-02-19 (×2): qty 0.4

## 2011-02-19 MED ORDER — ESCITALOPRAM OXALATE 10 MG PO TABS
10.0000 mg | ORAL_TABLET | Freq: Every day | ORAL | Status: DC
  Administered 2011-02-20 – 2011-02-22 (×3): 10 mg via ORAL
  Filled 2011-02-19 (×4): qty 1

## 2011-02-19 MED ORDER — SODIUM CHLORIDE 0.9 % IJ SOLN: 3.0000 mL | INTRAMUSCULAR | Status: DC | PRN

## 2011-02-19 MED ORDER — SODIUM CHLORIDE 0.9 % IV BOLUS (SEPSIS)
1000.0000 mL | Freq: Once | INTRAVENOUS | Status: AC
  Administered 2011-02-19: 1000 mL via INTRAVENOUS

## 2011-02-19 MED ORDER — PANTOPRAZOLE SODIUM 40 MG PO TBEC
40.0000 mg | DELAYED_RELEASE_TABLET | Freq: Every day | ORAL | Status: DC
  Administered 2011-02-20 – 2011-02-22 (×3): 40 mg via ORAL
  Filled 2011-02-19 (×4): qty 1

## 2011-02-19 MED ORDER — SODIUM CHLORIDE 0.9 % IV SOLN
250.0000 mL | INTRAVENOUS | Status: DC
  Administered 2011-02-19 – 2011-02-22 (×2): 250 mL via INTRAVENOUS

## 2011-02-19 MED ORDER — ACETAMINOPHEN 650 MG RE SUPP: 650.0000 mg | Freq: Four times a day (QID) | RECTAL | Status: DC | PRN

## 2011-02-19 MED ORDER — BEPOTASTINE BESILATE 1.5 % OP SOLN: 2.0000 [drp] | Freq: Two times a day (BID) | OPHTHALMIC | Status: DC | PRN

## 2011-02-19 MED ORDER — ONDANSETRON HCL 4 MG PO TABS: 4.0000 mg | ORAL_TABLET | Freq: Four times a day (QID) | ORAL | Status: DC | PRN

## 2011-02-19 MED ORDER — ENALAPRIL MALEATE 20 MG PO TABS
20.0000 mg | ORAL_TABLET | Freq: Every day | ORAL | Status: DC
  Administered 2011-02-20 – 2011-02-22 (×3): 20 mg via ORAL
  Filled 2011-02-19 (×4): qty 1

## 2011-02-19 MED ORDER — SODIUM CHLORIDE 0.9 % IJ SOLN
3.0000 mL | Freq: Two times a day (BID) | INTRAMUSCULAR | Status: DC
  Administered 2011-02-20 – 2011-02-21 (×2): 3 mL via INTRAVENOUS

## 2011-02-19 MED ORDER — PANCRELIPASE (LIP-PROT-AMYL) 12000-38000 UNITS PO CPEP
1.0000 | ORAL_CAPSULE | Freq: Three times a day (TID) | ORAL | Status: DC
  Administered 2011-02-20 – 2011-02-22 (×8): 1 via ORAL
  Filled 2011-02-19 (×12): qty 1

## 2011-02-19 MED ORDER — HYDROCHLOROTHIAZIDE 25 MG PO TABS
25.0000 mg | ORAL_TABLET | Freq: Every day | ORAL | Status: DC
  Administered 2011-02-20 – 2011-02-22 (×3): 25 mg via ORAL
  Filled 2011-02-19 (×4): qty 1

## 2011-02-19 MED ORDER — INSULIN REGULAR HUMAN 100 UNIT/ML IJ SOLN
30.0000 [IU] | Freq: Every day | INTRAMUSCULAR | Status: DC
  Filled 2011-02-19 (×4): qty 0.3

## 2011-02-19 MED ORDER — EVENING PRIMROSE OIL 1000 MG PO CAPS: 1.0000 | ORAL_CAPSULE | Freq: Every day | ORAL | Status: DC

## 2011-02-19 MED ORDER — SENNA 8.6 MG PO TABS: 2.0000 | ORAL_TABLET | Freq: Every day | ORAL | Status: DC | PRN

## 2011-02-19 MED ORDER — HYDROCODONE-ACETAMINOPHEN 5-325 MG PO TABS
1.0000 | ORAL_TABLET | ORAL | Status: DC | PRN
  Administered 2011-02-19: 1 via ORAL
  Administered 2011-02-20: 2 via ORAL
  Filled 2011-02-19: qty 2
  Filled 2011-02-19: qty 1
  Filled 2011-02-19: qty 2

## 2011-02-19 MED ORDER — ZOLPIDEM TARTRATE 5 MG PO TABS
5.0000 mg | ORAL_TABLET | Freq: Every evening | ORAL | Status: DC | PRN
  Administered 2011-02-20 – 2011-02-21 (×2): 5 mg via ORAL
  Filled 2011-02-19 (×2): qty 1

## 2011-02-19 NOTE — ED Notes (Signed)
Sandwich and coffee given to pt per request

## 2011-02-19 NOTE — ED Notes (Signed)
Pt is in MRI. Sandwich given to family and drink.

## 2011-02-19 NOTE — ED Notes (Signed)
Pt was seen her on [facility] for the same. She reports generalized weakness for 1 week. She has diabetes.

## 2011-02-19 NOTE — Progress Notes (Signed)
PHARMACIST - PHYSICIAN ORDER COMMUNICATION  CONCERNING: P&T Medication Policy on Herbal Medications  DESCRIPTION:  This patient's order for:  primrose  has been noted.  This product(s) is classified as an "herbal" or natural product. Due to a lack of definitive safety studies or FDA approval, nonstandard manufacturing practices, plus the potential risk of unknown drug-drug interactions while on inpatient medications, the Pharmacy and Therapeutics Committee does not permit the use of "herbal" or natural products of this type within [facility].   ACTION TAKEN: The pharmacy department is unable to verify this order at this time and your patient has been informed of this safety policy. Please reevaluate patient's clinical condition at discharge and address if the herbal or natural product(s) should be resumed at that time.  Malva Cogan 02/19/2011

## 2011-02-19 NOTE — ED Notes (Signed)
2 IV attempts failed by Paulino Rily and [place] RN. IV teamed called. Pt tolerated well. Pt and family report difficulty in the past gaining IV access.

## 2011-02-19 NOTE — ED Provider Notes (Addendum)
History     CSN: [id number] Arrival date & time: 02/19/2011  1:28 PM   First MD Initiated Contact with Patient 02/19/11 1605      Chief Complaint  Patient presents with  . Weakness    (Consider location/radiation/quality/duration/timing/severity/associated sxs/prior treatment) HPI Pt has been having weakness since Wednesday last week.  Pt feels drained.  She has not had any pain.  She has had this off and on ever since April.  She has also been feeling weak when she tries to stand.  She feels like her balance has been off. Family and patient state actually she's been having this trouble ongoing since the summer. It has been gradually getting worse. She's been seen in the emergency department at her doctor the diagnosis is been unclear. She has been having some depression. Patient feels like she has no appetite. Nothing seems to make it particularly better. Past Medical History  Diagnosis Date  . Other and unspecified noninfectious gastroenteritis and colitis   . Personal history of urinary calculi   . Arthropathy, unspecified, site unspecified   . Unspecified asthma   . Unspecified essential hypertension   . Type II or unspecified type diabetes mellitus without mention of complication, not stated as uncontrolled   . Other specified disease of pancreas     Past Surgical History  Procedure Date  . Tubal ligation   . Knee arthroscopy   . Left arm fracture     Family History  Problem Relation Age of Onset  . Diabetes Father     multiple family members  . Heart disease Father     brother, paternal aunt  . Colon cancer Neg Hx   . Lung cancer Father   . Leukemia Mother     History  Substance Use Topics  . Smoking status: Never Smoker   . Smokeless tobacco: Never Used  . Alcohol Use: No    OB History    Grav Para Term Preterm Abortions TAB SAB Ect Mult Living                  Review of Systems  Constitutional: Positive for fatigue. Negative for fever.  HENT:  Negative for neck pain.   Eyes: Negative for photophobia.  Respiratory: Negative for cough, chest tightness, shortness of breath and wheezing.   Cardiovascular: Negative for chest pain.  Gastrointestinal: Negative for abdominal distention.  Genitourinary: Negative for dysuria.  Neurological: Negative for light-headedness.  Psychiatric/Behavioral: Negative for behavioral problems and confusion.  All other systems reviewed and are negative.    Allergies  Phenytoin  Home Medications   Current Outpatient Rx  Name Route Sig Dispense Refill  . AMYLASE-LIPASE-PROTEASE 65-20-65 MU PO CPEP Oral Take 1 capsule by mouth 2 (two) times daily with a meal.     . ASPIRIN 81 MG PO TABS Oral Take 81 mg by mouth daily.     Marland Kitchen BEPOTASTINE BESILATE 1.5 % OP SOLN Both Eyes Place 2 drops into both eyes 2 (two) times daily as needed. Dry eyes     . ENALAPRIL MALEATE 20 MG PO TABS Oral Take 20 mg by mouth daily.     Marland Kitchen ESCITALOPRAM OXALATE 10 MG PO TABS Oral Take 10 mg by mouth daily.     Marland Kitchen EVENING PRIMROSE OIL 1000 MG PO CAPS Oral Take 1 capsule by mouth daily.      Marland Kitchen HYDROCHLOROTHIAZIDE 25 MG PO TABS Oral Take 25 mg by mouth daily.     . INSULIN REGULAR HUMAN  100 UNIT/ML IJ SOLN Subcutaneous Inject 30 Units into the skin daily.     Marland Kitchen METFORMIN HCL 1000 MG PO TABS Oral Take 1,000 mg by mouth 2 (two) times daily with a meal.     . OVER THE COUNTER MEDICATION Oral Take 1 tablet by mouth daily. DIABETIC VITAMIN PACK ([facility] BRAND)     . PANTOPRAZOLE SODIUM 40 MG PO TBEC Oral Take 40 mg by mouth daily.     Marland Kitchen ZOLPIDEM TARTRATE 5 MG PO TABS Oral Take 1 tablet (5 mg total) by mouth at bedtime as needed for sleep. 10 tablet 0    Wt 225 lb (102.059 kg)  Physical Exam  Nursing note and vitals reviewed. Constitutional: She appears well-developed and well-nourished. No distress.  HENT:  Head: Normocephalic and atraumatic.  Right Ear: External ear normal.  Left Ear: External ear normal.  Eyes: Conjunctivae are  normal. Right eye exhibits no discharge. Left eye exhibits no discharge. No scleral icterus.  Neck: Neck supple. No tracheal deviation present.  Cardiovascular: Normal rate, regular rhythm and intact distal pulses.   Pulmonary/Chest: Effort normal and breath sounds normal. No stridor. No respiratory distress. She has no wheezes. She has no rales.  Abdominal: Soft. Bowel sounds are normal. She exhibits no distension. There is no tenderness. There is no rebound and no guarding.  Musculoskeletal: She exhibits no edema and no tenderness.  Neurological: She is alert. She has normal strength. She displays no atrophy and no tremor. No sensory deficit. Cranial nerve deficit:  no gross defecits noted. She exhibits normal muscle tone. She displays no seizure activity. Coordination normal.       No pronator drift,  Skin: Skin is warm and dry. No rash noted.  Psychiatric: She has a normal mood and affect.    ED Course  Procedures (including critical care time)  Date: 02/19/2011  Rate: 68  Rhythm: normal sinus rhythm  QRS Axis: normal  Intervals: normal  ST/T Wave abnormalities: nonspecific T wave changes  Conduction Disutrbances:none  Narrative Interpretation:   Old EKG Reviewed: unchanged   Labs Reviewed  GLUCOSE, CAPILLARY - Abnormal; Notable for the following:    Glucose-Capillary 169 (*)    All other components within normal limits  CBC - Abnormal; Notable for the following:    Platelets 529 (*)    All other components within normal limits  COMPREHENSIVE METABOLIC PANEL - Abnormal; Notable for the following:    Glucose, Bld 138 (*)    Total Bilirubin 0.1 (*)    All other components within normal limits  LIPASE, BLOOD - Abnormal; Notable for the following:    Lipase 7 (*)    All other components within normal limits  URINALYSIS, ROUTINE W REFLEX MICROSCOPIC - Abnormal; Notable for the following:    Leukocytes, UA SMALL (*)    All other components within normal limits  DIFFERENTIAL    TROPONIN I  URINE MICROSCOPIC-ADD ON  POCT CBG MONITORING   Dg Chest 2 View  02/19/2011  *RADIOLOGY REPORT*  Clinical Data: Weakness.  CHEST - 2 VIEW  Comparison: 05/21/2008  Findings: Normal sized heart.  Clear lungs.  Thoracic spine degenerative changes.  IMPRESSION: No acute abnormality.  Original Report Authenticated By: Darrol Angel, M.D.   Mr Brain Wo Contrast  02/19/2011  *RADIOLOGY REPORT*  Clinical Data: Weakness  MRI HEAD WITHOUT CONTRAST  Technique:  Multiplanar, multiecho pulse sequences of the brain and surrounding structures were obtained according to standard protocol without intravenous contrast.  Comparison: None.  Findings: Small focus of restricted diffusion in the posterior limb internal capsule on the right.  This may represent acute small vessel infarct although it could be an artifact.  Correlate with neurologic findings.  Scattered small white matter hyperintensities most likely related to chronic ischemia.  Hyperintensity in the left sub insular white matter appears to represent chronic ischemia as well.  Brainstem and cerebellum are intact.  Negative for hemorrhage or fluid collection.  No mass or edema is identified.  Chronic sinusitis with complete opacification of the right maxillary sinus  IMPRESSION: Chronic microvascular ischemia.  Possible small area of acute infarction in the posterior internal capsule on the right.  Original Report Authenticated By: Camelia Phenes, M.D.    MDM   The patient has been having complaints of fatigue weakness and now some gait disturbance ongoing for several weeks to months. Her MRI suggests the possibility of a small acute infarction posterior internal capsule on the right. Not sure of her constellation of symptoms or correlate with that. Considering these findings will consult neurology to discuss whether inpatient admission is necessary.    7:51 PM I have discussed the case with Dr Anne Hahn.  MRI findings reviewed.  Question  whether this is an acute infarct.  Not sure that it would correlate with her symptoms.  The size is almost pinhead size.    8:40 PM findings discussed with the patient. She states she is too weak to go home. She cannot walk. I will consult medicine for admission to   Diagnosis:  Weakness  Celene Kras, MD 02/19/11 1954  Celene Kras, MD 02/19/11 2053

## 2011-02-19 NOTE — ED Notes (Signed)
Pt states "I haven't felt like doing nothing since Wednesday of last week, was seen here, not hurting anywhere, when I eat I feel like I want to throw up, my MD is in [place] and I'm here so I came back here"

## 2011-02-19 NOTE — ED Notes (Signed)
Pt now stating "I haven't checked my blood sugar since last night".

## 2011-02-19 NOTE — H&P (Signed)
Vanessa [name] is an 60 y.o. female.   Chief Complaint: weakness HPI: the pt is here because of progressive weakness all over her body that starts at her neck and goes down.  She denies any other symptoms except having a "wobbly" gait over the past week and being very unsteady on her feet.  Nothing hurts and in fact she was seen in the ED last week and diagnosed with hyperglycemia and sent home.  The pt's brother and sister passed away within the past year and she has been under a lot of stress.  Denies any other complaints  Past Medical History  Diagnosis Date  . Other and unspecified noninfectious gastroenteritis and colitis   . Personal history of urinary calculi   . Arthropathy, unspecified, site unspecified   . Unspecified asthma   . Unspecified essential hypertension   . Type II or unspecified type diabetes mellitus without mention of complication, not stated as uncontrolled   . Other specified disease of pancreas     Past Surgical History  Procedure Date  . Tubal ligation   . Knee arthroscopy   . Left arm fracture     Family History  Problem Relation Age of Onset  . Diabetes Father     multiple family members  . Heart disease Father     brother, paternal aunt  . Colon cancer Neg Hx   . Lung cancer Father   . Leukemia Mother    Social History:  reports that she has never smoked. She has never used smokeless tobacco. She reports that she does not drink alcohol or use illicit drugs.  Allergies:  Allergies  Allergen Reactions  . Phenytoin Other (See Comments)    unknown    Medications Prior to Admission  Medication Dose Route Frequency Provider Last Rate Last Dose  . sodium chloride 0.9 % bolus 1,000 mL  1,000 mL Intravenous Once Celene Kras, MD   1,000 mL at 02/19/11 1630   Medications Prior to Admission  Medication Sig Dispense Refill  . amylase-lipase-protease (PANGESTYME UL 20) 65-20-65 MU per capsule Take 1 capsule by mouth 2 (two) times daily with a meal.        . aspirin 81 MG tablet Take 81 mg by mouth daily.       . enalapril (VASOTEC) 20 MG tablet Take 20 mg by mouth daily.       Marland Kitchen escitalopram (LEXAPRO) 10 MG tablet Take 10 mg by mouth daily.       . Evening Primrose Oil 1000 MG CAPS Take 1 capsule by mouth daily.        . hydrochlorothiazide 25 MG tablet Take 25 mg by mouth daily.       . insulin regular (HUMULIN R,NOVOLIN R) 100 units/mL injection Inject 30 Units into the skin daily.       . metFORMIN (GLUCOPHAGE) 1000 MG tablet Take 1,000 mg by mouth 2 (two) times daily with a meal.       . OVER THE COUNTER MEDICATION Take 1 tablet by mouth daily. DIABETIC VITAMIN PACK ([facility] BRAND)       . pantoprazole (PROTONIX) 40 MG tablet Take 40 mg by mouth daily.       Marland Kitchen zolpidem (AMBIEN) 5 MG tablet Take 1 tablet (5 mg total) by mouth at bedtime as needed for sleep.  10 tablet  0    Results for orders placed during the hospital encounter of 02/19/11 (from the past 48 hour(s))  GLUCOSE, CAPILLARY  Status: Abnormal   Collection Time   02/19/11  3:02 PM      Component Value Range Comment   Glucose-Capillary 169 (*) 70 - 99 (mg/dL)    Comment 1 Notify RN     URINALYSIS, ROUTINE W REFLEX MICROSCOPIC     Status: Abnormal   Collection Time   02/19/11  4:36 PM      Component Value Range Comment   Color, Urine YELLOW  YELLOW     Appearance CLEAR  CLEAR     Specific Gravity, Urine 1.024  1.005 - 1.030     pH 7.0  5.0 - 8.0     Glucose, UA NEGATIVE  NEGATIVE (mg/dL)    Hgb urine dipstick NEGATIVE  NEGATIVE     Bilirubin Urine NEGATIVE  NEGATIVE     Ketones, ur NEGATIVE  NEGATIVE (mg/dL)    Protein, ur NEGATIVE  NEGATIVE (mg/dL)    Urobilinogen, UA 1.0  0.0 - 1.0 (mg/dL)    Nitrite NEGATIVE  NEGATIVE     Leukocytes, UA SMALL (*) NEGATIVE    URINE MICROSCOPIC-ADD ON     Status: Normal   Collection Time   02/19/11  4:36 PM      Component Value Range Comment   Squamous Epithelial / LPF RARE  RARE     WBC, UA 7-10  <3 (WBC/hpf)    Bacteria,  UA RARE  RARE     Urine-Other MUCOUS PRESENT     CBC     Status: Abnormal   Collection Time   02/19/11  5:15 PM      Component Value Range Comment   WBC 8.9  4.0 - 10.5 (K/uL)    RBC 4.78  3.87 - 5.11 (MIL/uL)    Hemoglobin 12.9  12.0 - 15.0 (g/dL)    HCT 16.1  09.6 - 04.5 (%)    MCV 83.1  78.0 - 100.0 (fL)    MCH 27.0  26.0 - 34.0 (pg)    MCHC 32.5  30.0 - 36.0 (g/dL)    RDW 40.9  81.1 - 91.4 (%)    Platelets 529 (*) 150 - 400 (K/uL)   DIFFERENTIAL     Status: Normal   Collection Time   02/19/11  5:15 PM      Component Value Range Comment   Neutrophils Relative 67  43 - 77 (%)    Neutro Abs 6.0  1.7 - 7.7 (K/uL)    Lymphocytes Relative 23  12 - 46 (%)    Lymphs Abs 2.0  0.7 - 4.0 (K/uL)    Monocytes Relative 5  3 - 12 (%)    Monocytes Absolute 0.5  0.1 - 1.0 (K/uL)    Eosinophils Relative 5  0 - 5 (%)    Eosinophils Absolute 0.5  0.0 - 0.7 (K/uL)    Basophils Relative 0  0 - 1 (%)    Basophils Absolute 0.0  0.0 - 0.1 (K/uL)   COMPREHENSIVE METABOLIC PANEL     Status: Abnormal   Collection Time   02/19/11  5:15 PM      Component Value Range Comment   Sodium 136  135 - 145 (mEq/L)    Potassium 3.9  3.5 - 5.1 (mEq/L)    Chloride 97  96 - 112 (mEq/L)    CO2 28  19 - 32 (mEq/L)    Glucose, Bld 138 (*) 70 - 99 (mg/dL)    BUN 10  6 - 23 (mg/dL)    Creatinine, Ser 7.82  0.50 - 1.10 (mg/dL)    Calcium 16.1  8.4 - 10.5 (mg/dL)    Total Protein 8.0  6.0 - 8.3 (g/dL)    Albumin 3.8  3.5 - 5.2 (g/dL)    AST 17  0 - 37 (U/L)    ALT 17  0 - 35 (U/L)    Alkaline Phosphatase 100  39 - 117 (U/L)    Total Bilirubin 0.1 (*) 0.3 - 1.2 (mg/dL)    GFR calc non Af Amer >90  >90 (mL/min)    GFR calc Af Amer >90  >90 (mL/min)   LIPASE, BLOOD     Status: Abnormal   Collection Time   02/19/11  5:15 PM      Component Value Range Comment   Lipase 7 (*) 11 - 59 (U/L)   TROPONIN I     Status: Normal   Collection Time   02/19/11  5:15 PM      Component Value Range Comment   Troponin I <0.30   <0.30 (ng/mL)    Dg Chest 2 View  02/19/2011  *RADIOLOGY REPORT*  Clinical Data: Weakness.  CHEST - 2 VIEW  Comparison: 05/21/2008  Findings: Normal sized heart.  Clear lungs.  Thoracic spine degenerative changes.  IMPRESSION: No acute abnormality.  Original Report Authenticated By: Darrol Angel, M.D.   Mr Brain Wo Contrast  02/19/2011  *RADIOLOGY REPORT*  Clinical Data: Weakness  MRI HEAD WITHOUT CONTRAST  Technique:  Multiplanar, multiecho pulse sequences of the brain and surrounding structures were obtained according to standard protocol without intravenous contrast.  Comparison: None.  Findings: Small focus of restricted diffusion in the posterior limb internal capsule on the right.  This may represent acute small vessel infarct although it could be an artifact.  Correlate with neurologic findings.  Scattered small white matter hyperintensities most likely related to chronic ischemia.  Hyperintensity in the left sub insular white matter appears to represent chronic ischemia as well.  Brainstem and cerebellum are intact.  Negative for hemorrhage or fluid collection.  No mass or edema is identified.  Chronic sinusitis with complete opacification of the right maxillary sinus  IMPRESSION: Chronic microvascular ischemia.  Possible small area of acute infarction in the posterior internal capsule on the right.  Original Report Authenticated By: Camelia Phenes, M.D.    Review of Systems  Constitutional: Positive for malaise/fatigue. Negative for fever, chills, weight loss and diaphoresis.  HENT: Negative for hearing loss, ear pain, nosebleeds, congestion, sore throat, neck pain, tinnitus and ear discharge.   Eyes: Negative.   Respiratory: Negative for cough, hemoptysis, sputum production, shortness of breath, wheezing and stridor.   Cardiovascular: Negative.  Negative for chest pain, palpitations, orthopnea, claudication, leg swelling and PND.  Gastrointestinal: Negative.   Musculoskeletal: Negative  for myalgias, back pain, joint pain and falls.  Skin: Negative for itching and rash.  Neurological: Positive for focal weakness and weakness. Negative for headaches.  Endo/Heme/Allergies: Negative.   Psychiatric/Behavioral: Positive for depression.    Blood pressure 122/64, pulse 83, temperature 98 F (36.7 C), temperature source Oral, resp. rate 20, weight 102.059 kg (225 lb), SpO2 94.00%. Physical Exam  Constitutional: She is oriented to person, place, and time. She appears well-developed and well-nourished.  HENT:  Head: Normocephalic and atraumatic.  Eyes: Conjunctivae and EOM are normal. Pupils are equal, round, and reactive to light. Right eye exhibits no discharge. Left eye exhibits no discharge. No scleral icterus.  Neck: Normal range of motion. Neck supple. No JVD present. No  tracheal deviation present. No thyromegaly present.  Cardiovascular: Normal rate, regular rhythm, normal heart sounds and intact distal pulses.   Respiratory: No stridor.  GI: Soft. Bowel sounds are normal. She exhibits no distension and no mass. There is no tenderness. There is no rebound and no guarding.  Musculoskeletal: Normal range of motion. She exhibits no edema and no tenderness.  Lymphadenopathy:    She has no cervical adenopathy.  Neurological: She is alert and oriented to person, place, and time. No cranial nerve deficit. Coordination normal.  Skin: Skin is warm and dry. No rash noted. No erythema. No pallor.  Psychiatric: She has a normal mood and affect. Her behavior is normal. Judgment and thought content normal.     Assessment/Plan 1. Possible acute CVA- as per MRI findings but Dr Anne Hahn, Neurology, also looked at it and did not feel that her symptoms were consistent with the MRI description.  The pt will come to the hospital for PT, OT eval and also will get thyroid function panel, lipid panel and hemoglobin A1C checked.  She is already on an ASA and that will be increased to 162 mg.  Neuro  checks 2.  DM- check an A1C, cont with insulin 3. HTN- cont home meds  Melene Plan 02/19/2011, 9:30 PM

## 2011-02-20 DIAGNOSIS — I6350 Cerebral infarction due to unspecified occlusion or stenosis of unspecified cerebral artery: Secondary | ICD-10-CM

## 2011-02-20 LAB — COMPREHENSIVE METABOLIC PANEL
ALT: 13 U/L (ref 0–35)
AST: 12 U/L (ref 0–37)
Albumin: 3.1 g/dL — ABNORMAL LOW (ref 3.5–5.2)
Alkaline Phosphatase: 87 U/L (ref 39–117)
Calcium: 9.5 mg/dL (ref 8.4–10.5)
GFR calc Af Amer: >90 mL/min (ref >90)
Glucose, Bld: 237 mg/dL — ABNORMAL HIGH (ref 70–99)
Potassium: 3.8 mEq/L (ref 3.5–5.1)
Sodium: 138 mEq/L (ref 135–145)
Total Protein: 6.7 g/dL (ref 6.0–8.3)

## 2011-02-20 LAB — CBC
HCT: 35.9 % — ABNORMAL LOW (ref 36.0–46.0)
Hemoglobin: 11.6 g/dL — ABNORMAL LOW (ref 12.0–15.0)
MCH: 26.7 pg (ref 26.0–34.0)
MCHC: 32.3 g/dL (ref 30.0–36.0)
MCV: 82.7 fL (ref 78.0–100.0)
RDW: 14.9 % (ref 11.5–15.5)

## 2011-02-20 LAB — LIPID PANEL
Cholesterol: 191 mg/dL (ref 0–200)
HDL: 57 mg/dL (ref >39)
LDL Cholesterol: 115 mg/dL — ABNORMAL HIGH (ref 0–99)
Triglycerides: 94 mg/dL (ref <150)
VLDL: 19 mg/dL (ref 0–40)

## 2011-02-20 LAB — GLUCOSE, CAPILLARY: Glucose-Capillary: 112 mg/dL — ABNORMAL HIGH (ref 70–99)

## 2011-02-20 MED ORDER — ENOXAPARIN SODIUM 60 MG/0.6ML ~~LOC~~ SOLN
50.0000 mg | Freq: Every day | SUBCUTANEOUS | Status: DC
  Administered 2011-02-20 – 2011-02-21 (×2): 50 mg via SUBCUTANEOUS
  Filled 2011-02-20 (×4): qty 0.6

## 2011-02-20 MED ORDER — INSULIN GLARGINE 100 UNIT/ML ~~LOC~~ SOLN
30.0000 [IU] | Freq: Every day | SUBCUTANEOUS | Status: DC
  Administered 2011-02-20 – 2011-02-22 (×3): 30 [IU] via SUBCUTANEOUS
  Filled 2011-02-20: qty 3

## 2011-02-20 MED ORDER — INSULIN ASPART 100 UNIT/ML ~~LOC~~ SOLN
30.0000 [IU] | Freq: Every day | SUBCUTANEOUS | Status: DC
  Administered 2011-02-20: 30 [IU] via SUBCUTANEOUS
  Filled 2011-02-20: qty 3

## 2011-02-20 MED ORDER — INSULIN ASPART 100 UNIT/ML ~~LOC~~ SOLN
0.0000 [IU] | Freq: Three times a day (TID) | SUBCUTANEOUS | Status: DC
  Administered 2011-02-20: 4 [IU] via SUBCUTANEOUS
  Administered 2011-02-21: 3 [IU] via SUBCUTANEOUS
  Administered 2011-02-21: 4 [IU] via SUBCUTANEOUS
  Administered 2011-02-21: 3 [IU] via SUBCUTANEOUS
  Administered 2011-02-22: 11 [IU] via SUBCUTANEOUS
  Administered 2011-02-22: 3 [IU] via SUBCUTANEOUS

## 2011-02-20 NOTE — Progress Notes (Signed)
Subjective: "I just feel very tired and weak all over with no appetite."  Objective: Vital signs Filed Vitals:   02/19/11 1955 02/19/11 2113 02/19/11 2237 02/20/11 0557  BP:  122/64 115/67 144/83  Pulse:  83 62 55  Temp: 98.2 F (36.8 C) 98 F (36.7 C) 98.6 F (37 C) 98.4 F (36.9 C)  TempSrc:  Oral Oral Oral  Resp:  20 18 20   Height:   5\' 1"  (1.549 m)   Weight:   101.2 kg (223 lb 1.7 oz)   SpO2:  94% 97% 97%   Weight change:  Last BM Date: 02/18/11  Intake/Output from previous day: 11/12 0701 - 11/13 0700 In: 118.7 [I.V.:118.7] Out: 400 [Urine:400]     Physical Exam: General: Alert, awake, oriented x3, in no acute distress. HEENT: No bruits, no goiter. PERRL, EOMI Heart: Regular rate and rhythm, without murmurs, rubs, gallops. Lungs: Clear to auscultation bilaterally. No wheeze rhonchi or rales Abdomen: Soft, nontender, nondistended, positive bowel sounds but somewhat sluggish. Obese Extremities: No clubbing cyanosis or edema with positive pedal pulses. Neuro: Grossly intact, nonfocal. Cranial nerves II - XII intact. Speech clear    Lab Results: Basic Metabolic Panel:  Basename 02/20/11 0455 02/19/11 1715  NA 138 136  K 3.8 3.9  CL 97 97  CO2 27 28  GLUCOSE 237* 138*  BUN 10 10  CREATININE 0.54 0.58  CALCIUM 9.5 10.0  MG -- --  PHOS -- --   Liver Function Tests:  [facility] 02/20/11 0455 02/19/11 1715  AST 12 17  ALT 13 17  ALKPHOS 87 100  BILITOT 0.1* 0.1*  PROT 6.7 8.0  ALBUMIN 3.1* 3.8    Basename 02/19/11 1715  LIPASE 7*  AMYLASE --   No results found for this basename: AMMONIA:2 in the last 72 hours CBC:  Basename 02/20/11 0455 02/19/11 1715  WBC 7.9 8.9  NEUTROABS -- 6.0  HGB 11.6* 12.9  HCT 35.9* 39.7  MCV 82.7 83.1  PLT 449* 529*   Cardiac Enzymes:  Basename 02/19/11 1715  CKTOTAL --  CKMB --  CKMBINDEX --  TROPONINI <0.30   BNP: No results found for this basename: POCBNP:3 in the last 72 hours D-Dimer: No results  found for this basename: DDIMER:2 in the last 72 hours CBG:  Basename 02/19/11 1502  GLUCAP 169*   Hemoglobin A1C: No results found for this basename: HGBA1C in the last 72 hours Fasting Lipid Panel: No results found for this basename: CHOL,HDL,LDLCALC,TRIG,CHOLHDL,LDLDIRECT in the last 72 hours Thyroid Function Tests: No results found for this basename: TSH,T4TOTAL,FREET4,T3FREE,THYROIDAB in the last 72 hours Anemia Panel: No results found for this basename: VITAMINB12,FOLATE,FERRITIN,TIBC,IRON,RETICCTPCT in the last 72 hours Coagulation: No results found for this basename: LABPROT:2,INR:2 in the last 72 hours Urine Drug Screen:  Alcohol Level: No results found for this basename: ETH:2 in the last 72 hours Urinalysis: Misc. Labs:  No results found for this or any previous visit (from the past 240 hour(s)).  Studies/Results: Dg Chest 2 View  02/19/2011  *RADIOLOGY REPORT*  Clinical Data: Weakness.  CHEST - 2 VIEW  Comparison: 05/21/2008  Findings: Normal sized heart.  Clear lungs.  Thoracic spine degenerative changes.  IMPRESSION: No acute abnormality.  Original Report Authenticated By: Darrol Angel, M.D.   Mr Brain Wo Contrast  02/19/2011  *RADIOLOGY REPORT*  Clinical Data: Weakness  MRI HEAD WITHOUT CONTRAST  Technique:  Multiplanar, multiecho pulse sequences of the brain and surrounding structures were obtained according to standard protocol without intravenous contrast.  Comparison: None.  Findings: Small focus of restricted diffusion in the posterior limb internal capsule on the right.  This may represent acute small vessel infarct although it could be an artifact.  Correlate with neurologic findings.  Scattered small white matter hyperintensities most likely related to chronic ischemia.  Hyperintensity in the left sub insular white matter appears to represent chronic ischemia as well.  Brainstem and cerebellum are intact.  Negative for hemorrhage or fluid collection.  No mass  or edema is identified.  Chronic sinusitis with complete opacification of the right maxillary sinus  IMPRESSION: Chronic microvascular ischemia.  Possible small area of acute infarction in the posterior internal capsule on the right.  Original Report Authenticated By: Camelia Phenes, M.D.    Medications: Scheduled Meds:   . aspirin EC  162 mg Oral Daily  . enalapril  20 mg Oral Daily  . enoxaparin  40 mg Subcutaneous Q24H  . escitalopram  10 mg Oral Daily  . hydrochlorothiazide  25 mg Oral Daily  . insulin aspart  30 Units Subcutaneous QPC breakfast  . lipase/protease/amylase  1 capsule Oral TID AC  . pantoprazole  40 mg Oral Daily  . sodium chloride  1,000 mL Intravenous Once  . sodium chloride  3 mL Intravenous Q12H  . DISCONTD: Evening Primrose Oil  1 capsule Oral Daily  . DISCONTD: insulin regular  30 Units Subcutaneous Daily   Continuous Infusions:   . sodium chloride 250 mL (02/19/11 2308)   PRN Meds:.acetaminophen, acetaminophen, alum & mag hydroxide-simeth, Bepotastine Besilate, HYDROcodone-acetaminophen, ondansetron (ZOFRAN) IV, ondansetron, senna, sodium chloride, zolpidem  Assessment/Plan: 1. Possible acute CVA per MRI findings. Neurology consulted on admission. Continues with generalized weakness but no focal. Neuro exam as above. Continue ASA. Tele without arrythmia. Will request PT/OT. TSH and A1C pending. 2 decho and carotid dopp pending Active Problems:  * No active hospital problems. *  2. DM type 2 uncontrolled. Pt reports difficulty controlling BS over last several weeks. Metformin and Insulin adjusted by PCP. Will continue 30 units home dose and SSI. A1C pending  3. HTN: Fair control on HCTZ and ACE. Will monitor.  4. Generalized weakness with poor appetite: likely related to #1 and #2. Will monitor po intake. Provide antiemetics as needed.   LOS: 1 day   [facility] M 02/20/2011, 9:20 AM  ADDENDUM : I have seen and assessed patient and agree with Toya Smothers, NP assessment and plan. CVA workup underway. PT/OT. Change regularinsulin to Lantus and titrate. Risk factor modification. Follow.

## 2011-02-20 NOTE — Progress Notes (Signed)
2D Echo Completed.  Vanessa [name] 02/20/11. 5:13pm

## 2011-02-20 NOTE — Progress Notes (Signed)
  Currently receiving Lovenox 40mg  SQ q24h for VTE prophylaxis.  Wt = 101.2kg, Ht 61", BMI = 42.  CrCl 80 ml/min.  H/H = 11.6/35.9, PLT = 449.  A/P: Increase Lovenox to 50mg  SQ q24h (0.5mg /kg) since BMI >30 per [facility] Orders.  Rollene Fare, 02/20/2011, 1:55 PM

## 2011-02-20 NOTE — Progress Notes (Signed)
Physical Therapy Evaluation Patient Details Name: Vanessa [name] MRN: [id number] DOB: May 02, 1950 Today's Date: 02/20/2011 1350-1418 Ev2  Problem List:  Patient Active Problem List  Diagnoses  . DM  . HYPERTENSION  . ASTHMA  . PANCREATIC INSUFFICIENCY  . ARTHRITIS  . DIARRHEA  . NEPHROLITHIASIS, HX OF  . Nausea alone    Past Medical History:  Past Medical History  Diagnosis Date  . Other and unspecified noninfectious gastroenteritis and colitis   . Personal history of urinary calculi   . Arthropathy, unspecified, site unspecified   . Unspecified asthma   . Unspecified essential hypertension   . Type II or unspecified type diabetes mellitus without mention of complication, not stated as uncontrolled   . Other specified disease of pancreas    Past Surgical History:  Past Surgical History  Procedure Date  . Tubal ligation   . Knee arthroscopy   . Left arm fracture     PT Assessment/Plan/Recommendation PT Assessment Clinical Impression Statement: Patient admitted with weakness and MRI with acute infarct right post internal capsule presents with decreased activity tolerance, decreased balance, decreased LE strength and will benefit from skilled PT in the acute care setting to maximize independence to allow for d/c home with daughter to assist and HHPT. PT Recommendation/Assessment: Patient will need skilled PT in the acute care venue PT Problem List: Decreased balance;Decreased mobility;Decreased activity tolerance;Decreased strength;Decreased knowledge of use of DME PT Therapy Diagnosis : Abnormality of gait;Generalized weakness PT Plan PT Frequency: Min 3X/week PT Treatment/Interventions: Gait training;DME instruction;Stair training;Patient/family education;Functional mobility training;Therapeutic exercise;Balance training PT Recommendation Follow Up Recommendations: Home health PT Equipment Recommended: Rolling walker with 5" wheels PT Goals  Acute Rehab PT Goals PT  Goal Formulation: With patient/family Time For Goal Achievement: 2 weeks Pt will Transfer Sit to Stand/Stand to Sit: with modified independence PT Transfer Goal: Sit to Stand/Stand to Sit - Progress: Progressing toward goal Pt will Stand: with modified independence;with no upper extremity support;1 - 2 min (while performing simple functional task) PT Goal: Stand - Progress: Progressing toward goal Pt will Ambulate: >150 feet;with modified independence;with least restrictive assistive device PT Goal: Ambulate - Progress: Progressing toward goal Pt will Go Up / Down Stairs: 1-2 stairs;with min assist;with least restrictive assistive device PT Goal: Up/Down Stairs - Progress: Progressing toward goal  PT Evaluation Precautions/Restrictions  Precautions Precautions: Fall Prior Functioning  Home Living Lives With: Daughter (prior lived with mother, plans to d/c to daughter's home) Type of Home: Apartment Home Layout: One level Home Access: Stairs to enter Entrance Stairs-Rails: None Entrance Stairs-Number of Steps: 1-2 down, then 1-2 up Home Adaptive Equipment: None Prior Function Level of Independence: Independent with basic ADLs;Independent with homemaking with ambulation;Independent with gait Driving: No 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 Cognition Cognition Arousal/Alertness: Awake/alert Overall Cognitive Status: Appears within functional limits for tasks assessed Orientation Level: Oriented X4 Sensation/Coordination Sensation Light Touch: Appears Intact Coordination Gross Motor Movements are Fluid and Coordinated: Yes (toe tapping WFL) Extremity Assessment RUE Assessment RUE Assessment: Not tested LUE Assessment LUE Assessment: Not tested RLE Assessment RLE Assessment: Exceptions to [facility] RLE Strength Right Hip Flexion: 3+/5 Right Knee Flexion: 4/5 Right Knee Extension: 5/5 Right Ankle Dorsiflexion: 5/5 LLE Assessment LLE Assessment: Exceptions to [facility] LLE  Strength Left Hip Flexion: 3+/5 Left Knee Flexion: 4/5 Left Knee Extension: 5/5 Left Ankle Dorsiflexion: 5/5 Mobility (including Balance) Bed Mobility Bed Mobility: Yes Supine to Sit: 6: Modified independent (Device/Increase time) Sit to Supine - Left: 6: Modified independent (Device/Increase time) Transfers  Sit to Stand: 3: Mod assist;From bed;With upper extremity assist (performed with supervision from 3:1) Sit to Stand Details (indicate cue type and reason): pulls up on walker despite cues to push from bed Stand to Sit: 4: Min assist;To bed Stand to Sit Details: cues for positioning in bed Ambulation/Gait Ambulation/Gait: Yes Ambulation/Gait Assistance: 4: Min assist Ambulation/Gait Assistance Details (indicate cue type and reason): cues for safety with walker and with turns Ambulation Distance (Feet): 40 Feet Assistive device: Rolling walker Gait Pattern: Decreased stride length;Trunk flexed  Static Standing Balance Static Standing - Balance Support: No upper extremity supported Static Standing - Level of Assistance: 5: Stand by assistance Static Standing - Comment/# of Minutes: momentarily while washing and drying hands at sink Exercise    End of Session PT - End of Session Equipment Utilized During Treatment: Gait belt Activity Tolerance: Patient limited by fatigue Patient left: in bed;with family/visitor present General Behavior During Session: [facility] for tasks performed Cognition: [facility] for tasks performed  [facility] 02/20/2011, 4:54 PM

## 2011-02-20 NOTE — Progress Notes (Addendum)
*  PRELIMINARY RESULTS* Bilateral carotid dopplers performed. Preliminary findings showed no ICA stenosis. Antegrade vertebral artery flow.  [place]jour[place], Behringer 02/20/2011, 10:35 AM

## 2011-02-20 NOTE — Progress Notes (Signed)
Patient takes Lantus 30 units subcutaneously daily at home-- not regular insulin.  Contacted pharmacy to correct medication reconciliation form.  MD will need order Lantus if desired.

## 2011-02-20 NOTE — Progress Notes (Signed)
Attempted to see pt. For evaluation.  Pt. Undergoing a procedure, will re-attempt today as time allows.

## 2011-02-21 LAB — GLUCOSE, CAPILLARY: Glucose-Capillary: 168 mg/dL — ABNORMAL HIGH (ref 70–99)

## 2011-02-21 LAB — RPR: RPR Ser Ql: NONREACTIVE

## 2011-02-21 MED ORDER — SIMVASTATIN 20 MG PO TABS
20.0000 mg | ORAL_TABLET | Freq: Every day | ORAL | Status: DC
  Administered 2011-02-21: 20 mg via ORAL
  Filled 2011-02-21 (×3): qty 1

## 2011-02-21 NOTE — Progress Notes (Signed)
Subjective: "I'm feeling better than i was but still not back to normal".  Objective: Vital signs Filed Vitals:   02/20/11 1128 02/20/11 2135 02/21/11 0632 02/21/11 1300  BP: 144/84 115/74 126/70 143/76  Pulse: 84 60 67 77  Temp:  97.9 F (36.6 C) 98.4 F (36.9 C) 98.7 F (37.1 C)  TempSrc:  Oral  Oral  Resp:  18 20 20   Height:      Weight:      SpO2:  96% 94% 98%   Weight change:  Last BM Date: 02/17/11 (according to patient and daughter. )  Intake/Output from previous day: 11/13 0701 - 11/14 0700 In: 1498 [P.O.:1000; I.V.:498] Out: 1000 [Urine:1000] Total I/O In: 560 [P.O.:560] Out: 501 [Urine:500; Stool:1]   Physical Exam: General: Alert, awake, oriented x3, in no acute distress. HEENT: No bruits, no goiter. Mucus membranes mouth moist and pink. PERRL EOMI Heart: Regular rate and rhythm, without murmurs, rubs, gallops. Lungs: Normal effort BSCTAB No wheeze/rhonchi/rales Abdomen: Obese Soft, nontender, nondistended, positive bowel sounds. Extremities: No clubbing cyanosis or edema with positive pedal pulses. Neuro: Grossly intact, nonfocal. Cranial nerves II-XII intact.     Lab Results: Basic Metabolic Panel:  Basename 02/20/11 0455 02/19/11 1715  NA 138 136  K 3.8 3.9  CL 97 97  CO2 27 28  GLUCOSE 237* 138*  BUN 10 10  CREATININE 0.54 0.58  CALCIUM 9.5 10.0  MG -- --  PHOS -- --   Liver Function Tests:  [facility] 02/20/11 0455 02/19/11 1715  AST 12 17  ALT 13 17  ALKPHOS 87 100  BILITOT 0.1* 0.1*  PROT 6.7 8.0  ALBUMIN 3.1* 3.8    Basename 02/19/11 1715  LIPASE 7*  AMYLASE --   No results found for this basename: AMMONIA:2 in the last 72 hours CBC:  Basename 02/20/11 0455 02/19/11 1715  WBC 7.9 8.9  NEUTROABS -- 6.0  HGB 11.6* 12.9  HCT 35.9* 39.7  MCV 82.7 83.1  PLT 449* 529*   Cardiac Enzymes:  Basename 02/19/11 1715  CKTOTAL --  CKMB --  CKMBINDEX --  TROPONINI <0.30   BNP: No results found for this basename: POCBNP:3 in  the last 72 hours D-Dimer: No results found for this basename: DDIMER:2 in the last 72 hours CBG:  Basename 02/21/11 1136 02/21/11 0743 02/20/11 2014 02/20/11 1654 02/20/11 1432 02/20/11 0733  GLUCAP 130* 143* 112* 162* 114* 169*   Hemoglobin A1C:  Basename 02/20/11 0455  HGBA1C 7.5*   Fasting Lipid Panel:  Basename 02/20/11 1040  CHOL 191  HDL 57  LDLCALC 115*  TRIG 94  CHOLHDL 3.4  LDLDIRECT --   Thyroid Function Tests:  Basename 02/20/11 0455  TSH 1.652  T4TOTAL --  FREET4 1.02  T3FREE --  THYROIDAB --   Anemia Panel: No results found for this basename: VITAMINB12,FOLATE,FERRITIN,TIBC,IRON,RETICCTPCT in the last 72 hours Coagulation: No results found for this basename: LABPROT:2,INR:2 in the last 72 hours Urine Drug Screen:  Alcohol Level: No results found for this basename: ETH:2 in the last 72 hours Urinalysis:  Misc. Labs:  No results found for this or any previous visit (from the past 240 hour(s)).  Studies/Results: Dg Chest 2 View  02/19/2011  *RADIOLOGY REPORT*  Clinical Data: Weakness.  CHEST - 2 VIEW  Comparison: 05/21/2008  Findings: Normal sized heart.  Clear lungs.  Thoracic spine degenerative changes.  IMPRESSION: No acute abnormality.  Original Report Authenticated By: Darrol Angel, M.D.   Mr Brain Wo Contrast  02/19/2011  *  RADIOLOGY REPORT*  Clinical Data: Weakness  MRI HEAD WITHOUT CONTRAST  Technique:  Multiplanar, multiecho pulse sequences of the brain and surrounding structures were obtained according to standard protocol without intravenous contrast.  Comparison: None.  Findings: Small focus of restricted diffusion in the posterior limb internal capsule on the right.  This may represent acute small vessel infarct although it could be an artifact.  Correlate with neurologic findings.  Scattered small white matter hyperintensities most likely related to chronic ischemia.  Hyperintensity in the left sub insular white matter appears to  represent chronic ischemia as well.  Brainstem and cerebellum are intact.  Negative for hemorrhage or fluid collection.  No mass or edema is identified.  Chronic sinusitis with complete opacification of the right maxillary sinus  IMPRESSION: Chronic microvascular ischemia.  Possible small area of acute infarction in the posterior internal capsule on the right.  Original Report Authenticated By: Camelia Phenes, M.D.    Medications: Scheduled Meds:   . aspirin EC  162 mg Oral Daily  . enalapril  20 mg Oral Daily  . enoxaparin (LOVENOX) injection  50 mg Subcutaneous QHS  . escitalopram  10 mg Oral Daily  . hydrochlorothiazide  25 mg Oral Daily  . insulin aspart  0-20 Units Subcutaneous TID WC  . insulin glargine  30 Units Subcutaneous Daily  . lipase/protease/amylase  1 capsule Oral TID AC  . pantoprazole  40 mg Oral Daily  . sodium chloride  3 mL Intravenous Q12H   Continuous Infusions:   . sodium chloride 250 mL (02/19/11 2308)   PRN Meds:.acetaminophen, acetaminophen, alum & mag hydroxide-simeth, Bepotastine Besilate, HYDROcodone-acetaminophen, ondansetron (ZOFRAN) IV, ondansetron, senna, sodium chloride, zolpidem  Assessment/Plan:  Active Problems: 1. Possible acute CVA per MRI findings. Clinical picture dose not correlate. Suspect symptoms related to uncontrolled DM and increased stress with life/family situation. TSH WNL. Echo with 55-60% EF. Carotid doppler no ICA stenosis. Continues with generalized weakness but improved. PT/OT. Will mobilize.  2. DM 2. Uncontrolled. Better control with lantus. A!C 7.5.  3. HTN: fair control. Continue current meds. 4. Generalized weakness with poor appetite. Likely related to #1 and #2. Improved. Will increase mobilization. Cont PT  LOS: 2 days   [facility] M 02/21/2011, 3:03 PM  ADDENDUM -I have seen/examined and evaluated of patient, and agree with Thresa Ross at assessment and plan. Will add Zocor for risk modification  given her an LDL  of 115 in the setting of possible CVA. Will follow up.

## 2011-02-22 LAB — GLUCOSE, CAPILLARY
Glucose-Capillary: 141 mg/dL — ABNORMAL HIGH (ref 70–99)
Glucose-Capillary: 252 mg/dL — ABNORMAL HIGH (ref 70–99)
Glucose-Capillary: 110 mg/dL — ABNORMAL HIGH (ref 70–99)

## 2011-02-22 MED ORDER — ALUM & MAG HYDROXIDE-SIMETH 200-200-20 MG/5ML PO SUSP: 30.0000 mL | Freq: Four times a day (QID) | ORAL | Status: AC | PRN

## 2011-02-22 MED ORDER — INSULIN ASPART 100 UNIT/ML ~~LOC~~ SOLN: 0.0000 [IU] | Freq: Three times a day (TID) | SUBCUTANEOUS | Status: DC

## 2011-02-22 MED ORDER — SIMVASTATIN 20 MG PO TABS: 20.0000 mg | ORAL_TABLET | Freq: Every day | ORAL | Status: DC

## 2011-02-22 MED ORDER — SENNA 8.6 MG PO TABS: 2.0000 | ORAL_TABLET | Freq: Every day | ORAL | Status: DC | PRN

## 2011-02-22 MED ORDER — INSULIN ASPART 100 UNIT/ML ~~LOC~~ SOLN
0.0000 [IU] | Freq: Three times a day (TID) | SUBCUTANEOUS | Status: DC
Start: 2011-02-22 — End: 2011-02-22

## 2011-02-22 MED ORDER — ACETAMINOPHEN 325 MG PO TABS: 650.0000 mg | ORAL_TABLET | Freq: Four times a day (QID) | ORAL | Status: AC | PRN

## 2011-02-22 NOTE — Progress Notes (Signed)
TALKED TO PATIENT WITH DAUGHTER PRESENT ABOUT HHPT; PATIENT AND DAUGHTER CHOSE [facility] FOR HHC NEEDS; KRISTAN RN WITH [facility] CALLED FOR ARRANGEMENTS; MD PLEASE WRITE HHC ORDERS FOR HHPT AT DISCHARGE; [facility] RN, BSN. MHA

## 2011-02-22 NOTE — Progress Notes (Signed)
Subjective: "Im feeling better. I guess i'm ready to go home" No complaints  Objective: Vital signs Filed Vitals:   02/21/11 0632 02/21/11 1300 02/21/11 2137 02/22/11 0620  BP: 126/70 143/76 125/76 119/74  Pulse: 67 77 53 74  Temp: 98.4 F (36.9 C) 98.7 F (37.1 C) 98.1 F (36.7 C) 98.5 F (36.9 C)  TempSrc:  Oral Oral Oral  Resp: 20 20 18 18   Height:      Weight:      SpO2: 94% 98% 97% 96%   Weight change:  Last BM Date: 02/17/11 (according to patient and daughter. )  Intake/Output from previous day: 11/14 0701 - 11/15 0700 In: 1405.7 [P.O.:920; I.V.:365.7; IV Piggyback:120] Out: 1551 [Urine:1550; Stool:1]     Physical Exam: General: Alert, awake, oriented x3, in no acute distress. Smiling In bed HEENT: No bruits, no goiter. PERRL, EOMI, Mucus membranes mouth moist and pink Heart: Regular rate and rhythm, without murmurs, rubs, gallops. Lungs: Normal effort.  Clear to auscultation bilaterally. No wheeze, rhonchi Abdomen: Obese Soft, nontender, nondistended, positive bowel sounds. Extremities: No clubbing cyanosis or edema with positive pedal pulses. Neuro: Grossly intact, nonfocal. Speech clear, facial symmetry. Cranial nerve II-XII intact    Lab Results: Basic Metabolic Panel:  Basename 02/20/11 0455 02/19/11 1715  NA 138 136  K 3.8 3.9  CL 97 97  CO2 27 28  GLUCOSE 237* 138*  BUN 10 10  CREATININE 0.54 0.58  CALCIUM 9.5 10.0  MG -- --  PHOS -- --   Liver Function Tests:  [facility] 02/20/11 0455 02/19/11 1715  AST 12 17  ALT 13 17  ALKPHOS 87 100  BILITOT 0.1* 0.1*  PROT 6.7 8.0  ALBUMIN 3.1* 3.8    Basename 02/19/11 1715  LIPASE 7*  AMYLASE --   No results found for this basename: AMMONIA:2 in the last 72 hours CBC:  Basename 02/20/11 0455 02/19/11 1715  WBC 7.9 8.9  NEUTROABS -- 6.0  HGB 11.6* 12.9  HCT 35.9* 39.7  MCV 82.7 83.1  PLT 449* 529*   Cardiac Enzymes:  Basename 02/19/11 1715  CKTOTAL --  CKMB --  CKMBINDEX --    TROPONINI <0.30   BNP: No results found for this basename: POCBNP:3 in the last 72 hours D-Dimer: No results found for this basename: DDIMER:2 in the last 72 hours CBG:  Basename 02/22/11 0811 02/21/11 2140 02/21/11 1622 02/21/11 1136 02/21/11 0743 02/20/11 2014  GLUCAP 141* 96 168* 130* 143* 112*   Hemoglobin A1C:  Basename 02/20/11 0455  HGBA1C 7.5*   Fasting Lipid Panel:  Basename 02/20/11 1040  CHOL 191  HDL 57  LDLCALC 115*  TRIG 94  CHOLHDL 3.4  LDLDIRECT --   Thyroid Function Tests:  Basename 02/20/11 0455  TSH 1.652  T4TOTAL --  FREET4 1.02  T3FREE --  THYROIDAB --   Anemia Panel: No results found for this basename: VITAMINB12,FOLATE,FERRITIN,TIBC,IRON,RETICCTPCT in the last 72 hours Coagulation: No results found for this basename: LABPROT:2,INR:2 in the last 72 hours Urine Drug Screen:  Alcohol Level: No results found for this basename: ETH:2 in the last 72 hours Urinalysis:  Misc. Labs:  No results found for this or any previous visit (from the past 240 hour(s)).  Studies/Results: No results found.  Medications: Scheduled Meds:   . aspirin EC  162 mg Oral Daily  . enalapril  20 mg Oral Daily  . enoxaparin (LOVENOX) injection  50 mg Subcutaneous QHS  . escitalopram  10 mg Oral Daily  . hydrochlorothiazide  25 mg Oral Daily  . insulin aspart  0-20 Units Subcutaneous TID WC  . insulin glargine  30 Units Subcutaneous Daily  . lipase/protease/amylase  1 capsule Oral TID AC  . pantoprazole  40 mg Oral Daily  . simvastatin  20 mg Oral QHS  . sodium chloride  3 mL Intravenous Q12H   Continuous Infusions:   . sodium chloride 250 mL (02/22/11 0725)   PRN Meds:.acetaminophen, acetaminophen, alum & mag hydroxide-simeth, Bepotastine Besilate, HYDROcodone-acetaminophen, ondansetron (ZOFRAN) IV, ondansetron, senna, sodium chloride, zolpidem  Assessment/Plan:  Active Problems:  * No active hospital problems. *   Active Problems:  1. Possible  acute CVA per MRI findings. Clinical picture dose not correlate. Suspect symptoms related to uncontrolled DM and increased stress with life/family situation. TSH WNL. Echo with 55-60% EF. Carotid doppler no ICA stenosis. Continues with generalized weakness but improved. PT/OT. Will mobilize.  2. DM 2. Uncontrolled. Better control with lantus. A!C 7.5. May need endocrinology OP follow up 3. HTN: fair control. Continue current meds.  4. Generalized weakness with poor appetite. Likely related to #1 and #2. Continues to improve. Will increase mobilization. Cont PT. Will have HH PT 5. Dispo likely home  today with [facility] PT  LOS: 3 days   [facility] M 02/22/2011, 11:23 AM   Addendum - I have seen and examined patient, and I agree with [facility]'S evaluation and plan. She'll be discharged home with home health PT and follow up outpatient. Had long discussion with patient/family regarding lifestyle changes and she voiced understanding.

## 2011-02-22 NOTE — Discharge Summary (Signed)
Physician Discharge Summary  Patient ID: Vanessa [name] MRN: [id number] DOB/AGE: May 02, 1950 60 y.o.  Admit date: 02/19/2011 Discharge date: 02/22/2011  Primary Care Physician:  Kirstie Peri, MD   Discharge Diagnoses:    Present on Admission:  **None**  Current Discharge Medication List    START taking these medications   Details  acetaminophen (TYLENOL) 325 MG tablet Take 2 tablets (650 mg total) by mouth every 6 (six) hours as needed (or Fever >/= 101). Qty: 30 tablet, Refills: 0    alum & mag hydroxide-simeth (MAALOX/MYLANTA) 200-200-20 MG/5ML suspension Take 30 mLs by mouth every 6 (six) hours as needed (dyspepsia). Qty: 355 mL, Refills: 0    senna (SENOKOT) 8.6 MG TABS Take 2 tablets (17.2 mg total) by mouth daily as needed. Qty: 120 each, Refills: 0    simvastatin (ZOCOR) 20 MG tablet Take 1 tablet (20 mg total) by mouth at bedtime. Qty: 30 tablet, Refills: 0      CONTINUE these medications which have NOT CHANGED   Details  amylase-lipase-protease (PANGESTYME UL 20) 65-20-65 MU per capsule Take 1 capsule by mouth 2 (two) times daily with a meal.     aspirin 81 MG tablet Take 81 mg by mouth daily.     Bepotastine Besilate (BEPREVE) 1.5 % SOLN Place 2 drops into both eyes 2 (two) times daily as needed. Dry eyes     enalapril (VASOTEC) 20 MG tablet Take 20 mg by mouth daily.     escitalopram (LEXAPRO) 10 MG tablet Take 10 mg by mouth daily.     Evening Primrose Oil 1000 MG CAPS Take 1 capsule by mouth daily.      hydrochlorothiazide 25 MG tablet Take 25 mg by mouth daily.     insulin glargine (LANTUS) 100 UNIT/ML injection Inject 30 Units into the skin daily.      metFORMIN (GLUCOPHAGE) 1000 MG tablet Take 1,000 mg by mouth 2 (two) times daily with a meal.     OVER THE COUNTER MEDICATION Take 1 tablet by mouth daily. DIABETIC VITAMIN PACK ([employer] BRAND)     pantoprazole (PROTONIX) 40 MG tablet Take 40 mg by mouth daily.     zolpidem (AMBIEN) 5 MG tablet  Take 1 tablet (5 mg total) by mouth at bedtime as needed for sleep. Qty: 10 tablet, Refills: 0      STOP taking these medications     insulin regular (HUMULIN R,NOVOLIN R) 100 units/mL injection          Disposition and Follow-up:  Medically stable and ready for discharge to home with [facility] PT.  Consults:  Dr. Anne Hahn , neurology  Significant Diagnostic Studies:  Dg Chest 2 View  02/19/2011  *RADIOLOGY REPORT*  Clinical Data: Weakness.  CHEST - 2 VIEW  Comparison: 05/21/2008  Findings: Normal sized heart.  Clear lungs.  Thoracic spine degenerative changes.  IMPRESSION: No acute abnormality.  Original Report Authenticated By: Darrol Angel, M.D.   Mr Brain Wo Contrast  02/19/2011  *RADIOLOGY REPORT*  Clinical Data: Weakness  MRI HEAD WITHOUT CONTRAST  Technique:  Multiplanar, multiecho pulse sequences of the brain and surrounding structures were obtained according to standard protocol without intravenous contrast.  Comparison: None.  Findings: Small focus of restricted diffusion in the posterior limb internal capsule on the right.  This may represent acute small vessel infarct although it could be an artifact.  Correlate with neurologic findings.  Scattered small white matter hyperintensities most likely related to chronic ischemia.  Hyperintensity in the left sub  insular white matter appears to represent chronic ischemia as well.  Brainstem and cerebellum are intact.  Negative for hemorrhage or fluid collection.  No mass or edema is identified.  Chronic sinusitis with complete opacification of the right maxillary sinus  IMPRESSION: Chronic microvascular ischemia.  Possible small area of acute infarction in the posterior internal capsule on the right.  Original Report Authenticated By: Camelia Phenes, M.D.    Labs Reviewed  GLUCOSE, CAPILLARY - Abnormal; Notable for the following:    Glucose-Capillary 169 (*)    All other components within normal limits  CBC - Abnormal; Notable for the  following:    Platelets 529 (*)    All other components within normal limits  COMPREHENSIVE METABOLIC PANEL - Abnormal; Notable for the following:    Glucose, Bld 138 (*)    Total Bilirubin 0.1 (*)    All other components within normal limits  LIPASE, BLOOD - Abnormal; Notable for the following:    Lipase 7 (*)    All other components within normal limits  URINALYSIS, ROUTINE W REFLEX MICROSCOPIC - Abnormal; Notable for the following:    Leukocytes, UA SMALL (*)    All other components within normal limits  COMPREHENSIVE METABOLIC PANEL - Abnormal; Notable for the following:    Glucose, Bld 237 (*)    Albumin 3.1 (*)    Total Bilirubin 0.1 (*)    All other components within normal limits  CBC - Abnormal; Notable for the following:    Hemoglobin 11.6 (*)    HCT 35.9 (*)    Platelets 449 (*)    All other components within normal limits  HEMOGLOBIN A1C - Abnormal; Notable for the following:    Hemoglobin A1C 7.5 (*)    Mean Plasma Glucose 169 (*)    All other components within normal limits  LIPID PANEL - Abnormal; Notable for the following:    LDL Cholesterol 115 (*)    All other components within normal limits  GLUCOSE, CAPILLARY - Abnormal; Notable for the following:    Glucose-Capillary 169 (*)    All other components within normal limits  GLUCOSE, CAPILLARY - Abnormal; Notable for the following:    Glucose-Capillary 114 (*)    All other components within normal limits  GLUCOSE, CAPILLARY - Abnormal; Notable for the following:    Glucose-Capillary 162 (*)    All other components within normal limits  GLUCOSE, CAPILLARY - Abnormal; Notable for the following:    Glucose-Capillary 112 (*)    All other components within normal limits  GLUCOSE, CAPILLARY - Abnormal; Notable for the following:    Glucose-Capillary 143 (*)    All other components within normal limits  GLUCOSE, CAPILLARY - Abnormal; Notable for the following:    Glucose-Capillary 130 (*)    All other  components within normal limits  GLUCOSE, CAPILLARY - Abnormal; Notable for the following:    Glucose-Capillary 168 (*)    All other components within normal limits  GLUCOSE, CAPILLARY - Abnormal; Notable for the following:    Glucose-Capillary 141 (*)    All other components within normal limits  DIFFERENTIAL  TROPONIN I  URINE MICROSCOPIC-ADD ON  TSH  T4, FREE  RPR  GLUCOSE, CAPILLARY  POCT CBG MONITORING  POCT CBG MONITORING  POCT CBG MONITORING  FOLATE RBC        Dg Chest 2 View  02/19/2011  *RADIOLOGY REPORT*  Clinical Data: Weakness.  CHEST - 2 VIEW  Comparison: 05/21/2008  Findings: Normal sized heart.  Clear lungs.  Thoracic spine degenerative changes.  IMPRESSION: No acute abnormality.  Original Report Authenticated By: Darrol Angel, M.D.   Mr Brain Wo Contrast  02/19/2011  *RADIOLOGY REPORT*  Clinical Data: Weakness  MRI HEAD WITHOUT CONTRAST  Technique:  Multiplanar, multiecho pulse sequences of the brain and surrounding structures were obtained according to standard protocol without intravenous contrast.  Comparison: None.  Findings: Small focus of restricted diffusion in the posterior limb internal capsule on the right.  This may represent acute small vessel infarct although it could be an artifact.  Correlate with neurologic findings.  Scattered small white matter hyperintensities most likely related to chronic ischemia.  Hyperintensity in the left sub insular white matter appears to represent chronic ischemia as well.  Brainstem and cerebellum are intact.  Negative for hemorrhage or fluid collection.  No mass or edema is identified.  Chronic sinusitis with complete opacification of the right maxillary sinus  IMPRESSION: Chronic microvascular ischemia.  Possible small area of acute infarction in the posterior internal capsule on the right.  Original Report Authenticated By: Camelia Phenes, M.D.       Brief H and P: For complete details please refer to admission H and  P, but in brief HPI: the pt is here because of progressive weakness all over her body that starts at her neck and goes down. She denies any other symptoms except having a "wobbly" gait over the past week and being very unsteady on her feet. Nothing hurts and in fact she was seen in the ED last week and diagnosed with hyperglycemia and sent home. The pt's brother and sister passed away within the past year and she has been under a lot of stress. Denies any other complaints     Hospital Course:  There are no hospital problems to display for this patient.   Active Problems:  * No active hospital problems. *  1. Possible CVA per MRI. PT seen by Dr. Anne Hahn with neurology. PT admitted tele. No arrythmias. 2 decho 55-60%. Carotid doppler no ICA Stenosis. Suspect symptoms related to uncontrolled DM. PT while in hospital. Statin started for dyslipidemia with goal to get LDL less than 100. Pt reported on statin 2 years ago and developed cramping. Also reports cramping has continued off of statin. Will restart and pt instructed to let PCP know if cramping worsens. Cont ASA 2. DM 2. Uncontrolled. HA1c 7.5. Fair control in hospital. Recent changes to meds by PCP. Will continue lanuts and metformin. May need OP endocrinologist follow up. CM to make referral 3. HTN. Fair control in hosp. Continue home meds 4. Generalized weakness with poor appetite likely related to #1 and #2 and dep/anxiety. Improved. Have restarted lexapro . HH PT Time spent on Discharge: 40 minutes  Signed: Gwenyth Bender 02/22/2011, 11:49 AM  Addendum - I have seen/examined and evaluated of patient, agree with Thresa Ross above  evaluation and plan.

## 2011-04-28 ENCOUNTER — Other Ambulatory Visit: Payer: Self-pay

## 2011-04-28 ENCOUNTER — Emergency Department (HOSPITAL_COMMUNITY)
Admission: EM | Admit: 2011-04-28 | Discharge: 2011-04-29 | Disposition: A | Discharge status: Home / Self Care | Attending: Emergency Medicine | Admitting: Emergency Medicine

## 2011-04-28 ENCOUNTER — Encounter (HOSPITAL_COMMUNITY): Payer: Self-pay

## 2011-04-28 DIAGNOSIS — I10 Essential (primary) hypertension: Secondary | ICD-10-CM | POA: Insufficient documentation

## 2011-04-28 DIAGNOSIS — M7989 Other specified soft tissue disorders: Secondary | ICD-10-CM | POA: Insufficient documentation

## 2011-04-28 DIAGNOSIS — I2510 Atherosclerotic heart disease of native coronary artery without angina pectoris: Secondary | ICD-10-CM | POA: Insufficient documentation

## 2011-04-28 DIAGNOSIS — Z794 Long term (current) use of insulin: Secondary | ICD-10-CM | POA: Insufficient documentation

## 2011-04-28 DIAGNOSIS — R600 Localized edema: Secondary | ICD-10-CM

## 2011-04-28 DIAGNOSIS — M79609 Pain in unspecified limb: Secondary | ICD-10-CM | POA: Insufficient documentation

## 2011-04-28 DIAGNOSIS — Z8673 Personal history of transient ischemic attack (TIA), and cerebral infarction without residual deficits: Secondary | ICD-10-CM | POA: Insufficient documentation

## 2011-04-28 DIAGNOSIS — E119 Type 2 diabetes mellitus without complications: Secondary | ICD-10-CM | POA: Insufficient documentation

## 2011-04-28 DIAGNOSIS — R609 Edema, unspecified: Secondary | ICD-10-CM | POA: Insufficient documentation

## 2011-04-28 HISTORY — DX: Unspecified osteoarthritis, unspecified site: M19.90

## 2011-04-28 HISTORY — DX: Cerebral infarction, unspecified: I63.9

## 2011-04-28 HISTORY — DX: Atherosclerotic heart disease of native coronary artery without angina pectoris: I25.10

## 2011-04-28 LAB — CBC
Hemoglobin: 11.8 g/dL — ABNORMAL LOW (ref 12.0–15.0)
MCH: 28 pg (ref 26.0–34.0)
RBC: 4.22 MIL/uL (ref 3.87–5.11)
WBC: 8.7 10*3/uL (ref 4.0–10.5)

## 2011-04-28 LAB — DIFFERENTIAL
Eosinophils Absolute: 0.6 10*3/uL (ref 0.0–0.7)
Lymphocytes Relative: 30 % (ref 12–46)
Lymphs Abs: 2.6 10*3/uL (ref 0.7–4.0)
Monocytes Relative: 8 % (ref 3–12)
Neutro Abs: 4.8 10*3/uL (ref 1.7–7.7)
Neutrophils Relative %: 55 % (ref 43–77)

## 2011-04-28 NOTE — ED Notes (Signed)
Recent stroke in Nov

## 2011-04-28 NOTE — ED Notes (Signed)
Pt with family reports left leg pain just behind knee for several days with increased pain today- denies injury- elevated d-dimer. Pedal pulses palpated

## 2011-04-29 ENCOUNTER — Other Ambulatory Visit: Payer: Self-pay

## 2011-04-29 LAB — BASIC METABOLIC PANEL
BUN: 18 mg/dL (ref 6–23)
CO2: 27 mEq/L (ref 19–32)
Chloride: 99 mEq/L (ref 96–112)
Glucose, Bld: 123 mg/dL — ABNORMAL HIGH (ref 70–99)
Potassium: 4.2 mEq/L (ref 3.5–5.1)
Sodium: 137 mEq/L (ref 135–145)

## 2011-04-29 LAB — CARDIAC PANEL(CRET KIN+CKTOT+MB+TROPI)
CK, MB: 1.8 ng/mL (ref 0.3–4.0)
Total CK: 80 U/L (ref 7–177)
Troponin I: <0.3 ng/mL (ref <0.30)

## 2011-04-29 NOTE — ED Provider Notes (Signed)
History     CSN: [id number]  Arrival date & time 04/28/11  2200   First MD Initiated Contact with Patient 04/28/11 2224      Chief Complaint  Patient presents with  . Pain  . 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  She was seen two days ago at her neurologist at [facility] and they drew a d-dimer and ordered her outpatient doppler ultrasound for the 30th.  She states that she continues with the swelling and mild pain with ambulation so her daughter decided to bring her in - she denies chest pain and shortness of breath.  Patient is a 61 y.o. female presenting with leg pain. The history is provided by the patient and a relative. No language interpreter was used.  Leg Pain  The incident occurred more than 2 days ago. The incident occurred at home. There was no injury mechanism. The pain is present in the left knee. The quality of the pain is described as aching. The pain is at a severity of 2/10. The pain is mild. The pain has been constant since onset. Pertinent negatives include no numbness, no inability to bear weight, no loss of motion, no muscle weakness, no loss of sensation and no tingling. She reports no foreign bodies present. The symptoms are aggravated by bearing weight. She has tried nothing for the symptoms. The treatment provided no relief.    Past Medical History  Diagnosis Date  . Other and unspecified noninfectious gastroenteritis and colitis   . Personal history of urinary calculi   . Arthropathy, unspecified, site unspecified   . Unspecified asthma   . Unspecified essential hypertension   . Type II or unspecified type diabetes mellitus without mention of complication, not stated as uncontrolled   . Other specified  disease of pancreas   . Arthritis   . Stroke   . Coronary artery disease     Past Surgical History  Procedure Date  . Tubal ligation   . Knee arthroscopy   . Left arm fracture     Family History  Problem Relation Age of Onset  . Diabetes Father     multiple family members  . Heart disease Father     brother, paternal aunt  . Colon cancer Neg Hx   . Lung cancer Father   . Leukemia Mother     History  Substance Use Topics  . Smoking status: Never Smoker   . Smokeless tobacco: Never Used  . Alcohol Use: No    OB History    Grav Para Term Preterm Abortions TAB SAB Ect Mult Living                  Review of Systems  Constitutional: Negative for activity change and fatigue.  HENT: Negative for nosebleeds, facial swelling and neck pain.   Eyes: Negative for pain.  Respiratory: Negative for cough, chest tightness and shortness of breath.   Cardiovascular: Positive for leg swelling. Negative for chest pain and palpitations.  Gastrointestinal: Negative for abdominal pain.  Genitourinary: Negative for dysuria.  Musculoskeletal: Positive for gait problem. Negative for back pain and joint swelling.  Neurological: Negative for tingling, syncope, weakness, light-headedness, numbness and headaches.  Psychiatric/Behavioral: Negative for confusion.  All other systems reviewed and are negative.  Allergies  Phenytoin  Home Medications   Current Outpatient Rx  Name Route Sig Dispense Refill  . AMYLASE-LIPASE-PROTEASE 65-20-65 MU PO CPEP Oral Take 1 capsule by mouth 2 (two) times daily with a meal.     . ASPIRIN 81 MG PO TABS Oral Take 81 mg by mouth daily.     Marland Kitchen BEPOTASTINE BESILATE 1.5 % OP SOLN Both Eyes Place 2 drops into both eyes 2 (two) times daily as needed. Dry eyes     . ESCITALOPRAM OXALATE 10 MG PO TABS Oral Take 10 mg by mouth daily.     . INSULIN ASPART 100 UNIT/ML [place] SOLN Subcutaneous Inject 0-20 Units into the skin 3 (three) times daily with meals. Take as  directed based on CBG reading. Refer to discharge instructions. CBG 70 - 120: 0 units      CBG > 400 call MD and obtain STAT lab verification      CBG 121 - 150: 3 units      CBG 151 - 200: 4 units      CBG 201 - 250: 7 units      CBG 251 - 300: 11 units      CBG 301 - 350: 15 units      CBG 351 - 400: 20 units   1 pen 0  . INSULIN GLARGINE 100 UNIT/ML [place] SOLN Subcutaneous Inject 34 Units into the skin daily.     Marland Kitchen METFORMIN HCL 1000 MG PO TABS Oral Take 1,000 mg by mouth 2 (two) times daily with a meal.     . PANTOPRAZOLE SODIUM 40 MG PO TBEC Oral Take 40 mg by mouth daily.     . SENNA 8.6 MG PO TABS Oral Take 2 tablets (17.2 mg total) by mouth daily as needed. 120 each 0  . SIMVASTATIN 20 MG PO TABS Oral Take 1 tablet (20 mg total) by mouth at bedtime. 30 tablet 0  . ZOLPIDEM TARTRATE 5 MG PO TABS Oral Take 1 tablet (5 mg total) by mouth at bedtime as needed for sleep. 10 tablet 0    BP 102/66  Pulse 60  Temp(Src) 98.6 F (37 C) (Oral)  Resp 16  SpO2 97%  Physical Exam  Nursing note and vitals reviewed. Constitutional: She is oriented to person, place, and time. She appears well-developed and well-nourished. No distress.  HENT:  Head: Normocephalic and atraumatic.  Right Ear: External ear normal.  Left Ear: External ear normal.  Nose: Nose normal.  Mouth/Throat: Oropharynx is clear and moist. No oropharyngeal exudate.  Eyes: Conjunctivae are normal. Pupils are equal, round, and reactive to light. No scleral icterus.  Neck: Normal range of motion. Neck supple.  Cardiovascular: Normal rate, regular rhythm and normal heart sounds.  Exam reveals no gallop and no friction rub.   No murmur heard. Pulmonary/Chest: Effort normal and breath sounds normal. She exhibits no tenderness.  Abdominal: Soft. Bowel sounds are normal. There is no tenderness.  Musculoskeletal: Normal range of motion. She exhibits edema.       Mild LLE edema noted - no palpable cord noted - no pain to palpation  - negative Homan's  Lymphadenopathy:    She has no cervical adenopathy.  Neurological: She is alert and oriented to person, place, and time. No cranial nerve deficit.  Skin: Skin is warm and dry. No rash noted.  Psychiatric: She has a normal mood and affect. Her behavior is normal. Judgment and thought content normal.    ED Course  Procedures (  including critical care time)  Labs Reviewed  GLUCOSE, CAPILLARY - Abnormal; Notable for the following:    Glucose-Capillary 162 (*)    All other components within normal limits  CBC - Abnormal; Notable for the following:    Hemoglobin 11.8 (*)    HCT 34.5 (*)    RDW 15.9 (*)    Platelets 529 (*)    All other components within normal limits  DIFFERENTIAL - Abnormal; Notable for the following:    Eosinophils Relative 7 (*)    All other components within normal limits  BASIC METABOLIC PANEL - Abnormal; Notable for the following:    Glucose, Bld 123 (*)    All other components within normal limits  D-DIMER, QUANTITATIVE  CARDIAC PANEL(CRET KIN+CKTOT+MB+TROPI)   No results found.  Results for orders placed during the hospital encounter of 04/28/11  GLUCOSE, CAPILLARY      Component Value Range   Glucose-Capillary 162 (*) 70 - 99 (mg/dL)  CBC      Component Value Range   WBC 8.7  4.0 - 10.5 (K/uL)   RBC 4.22  3.87 - 5.11 (MIL/uL)   Hemoglobin 11.8 (*) 12.0 - 15.0 (g/dL)   HCT 45.4 (*) 09.8 - 46.0 (%)   MCV 81.8  78.0 - 100.0 (fL)   MCH 28.0  26.0 - 34.0 (pg)   MCHC 34.2  30.0 - 36.0 (g/dL)   RDW 11.9 (*) 14.7 - 15.5 (%)   Platelets 529 (*) 150 - 400 (K/uL)  DIFFERENTIAL      Component Value Range   Neutrophils Relative 55  43 - 77 (%)   Neutro Abs 4.8  1.7 - 7.7 (K/uL)   Lymphocytes Relative 30  12 - 46 (%)   Lymphs Abs 2.6  0.7 - 4.0 (K/uL)   Monocytes Relative 8  3 - 12 (%)   Monocytes Absolute 0.7  0.1 - 1.0 (K/uL)   Eosinophils Relative 7 (*) 0 - 5 (%)   Eosinophils Absolute 0.6  0.0 - 0.7 (K/uL)   Basophils Relative 1  0  - 1 (%)   Basophils Absolute 0.0  0.0 - 0.1 (K/uL)  BASIC METABOLIC PANEL      Component Value Range   Sodium 137  135 - 145 (mEq/L)   Potassium 4.2  3.5 - 5.1 (mEq/L)   Chloride 99  96 - 112 (mEq/L)   CO2 27  19 - 32 (mEq/L)   Glucose, Bld 123 (*) 70 - 99 (mg/dL)   BUN 18  6 - 23 (mg/dL)   Creatinine, Ser 8.29  0.50 - 1.10 (mg/dL)   Calcium 9.6  8.4 - 56.2 (mg/dL)   GFR calc non Af Amer >90  >90 (mL/min)   GFR calc Af Amer >90  >90 (mL/min)  D-DIMER, QUANTITATIVE      Component Value Range   D-Dimer, Quant 0.37  0.00 - 0.48 (ug/mL-FEU)  CARDIAC PANEL(CRET KIN+CKTOT+MB+TROPI)      Component Value Range   Total CK 80  7 - 177 (U/L)   CK, MB 1.8  0.3 - 4.0 (ng/mL)   Troponin I <0.30  <0.30 (ng/mL)   Relative Index RELATIVE INDEX IS INVALID  0.0 - 2.5    No results found.  Date: 04/29/2011  Rate: 53  Rhythm: sinus bradycardia  QRS Axis: normal  Intervals: normal  ST/T Wave abnormalities: normal  Conduction Disutrbances:none  Narrative Interpretation:Reviewed by Dr. Weldon Inches   Old EKG Reviewed: unchanged   LLE Edema   MDM  Repeat of  d-dimer here was negative, I have discussed this patient with Dr. Weldon Inches - there is no evidence of DVT or concern for PE.  Will schedule the outpatient doppler study here to save the patient from going to [facility] to have this done.  I believe this likely to be dependent edema related to the stroke.       Izola Price [place], [place] 04/29/11 [phone]

## 2011-04-29 NOTE — ED Provider Notes (Signed)
Medical screening examination/treatment/procedure(s) were performed by non-physician practitioner and as supervising physician I was immediately available for consultation/collaboration.  Cejay Cambre P Detron Carras, MD 04/29/11 0645 

## 2011-04-30 ENCOUNTER — Ambulatory Visit (HOSPITAL_COMMUNITY)
Admission: RE | Admit: 2011-04-30 | Discharge: 2011-04-30 | Disposition: A | Discharge status: Home / Self Care | Source: Ambulatory Visit | Attending: Emergency Medicine | Admitting: Emergency Medicine

## 2011-04-30 DIAGNOSIS — R609 Edema, unspecified: Secondary | ICD-10-CM

## 2011-04-30 DIAGNOSIS — M79609 Pain in unspecified limb: Secondary | ICD-10-CM | POA: Insufficient documentation

## 2011-04-30 DIAGNOSIS — M7989 Other specified soft tissue disorders: Secondary | ICD-10-CM | POA: Insufficient documentation

## 2011-04-30 DIAGNOSIS — R52 Pain, unspecified: Secondary | ICD-10-CM

## 2011-04-30 NOTE — Progress Notes (Addendum)
VASCULAR LAB PRELIMINARY  PRELIMINARY  PRELIMINARY  PRELIMINARY  Left lower extremity venous duplex completed.    Preliminary report:  Left:  No evidence of DVT or superficial thrombosis.  Small Baker's cyst noted in left popliteal fossa.    Terance Hart, RVT 04/30/2011, 10:39 AM

## 2011-08-29 ENCOUNTER — Emergency Department (HOSPITAL_COMMUNITY)

## 2011-08-29 ENCOUNTER — Emergency Department (HOSPITAL_COMMUNITY)
Admission: EM | Admit: 2011-08-29 | Discharge: 2011-08-29 | Disposition: A | Discharge status: Home / Self Care | Attending: Emergency Medicine | Admitting: Emergency Medicine

## 2011-08-29 ENCOUNTER — Encounter (HOSPITAL_COMMUNITY): Payer: Self-pay | Admitting: *Deleted

## 2011-08-29 DIAGNOSIS — M129 Arthropathy, unspecified: Secondary | ICD-10-CM | POA: Insufficient documentation

## 2011-08-29 DIAGNOSIS — Z8673 Personal history of transient ischemic attack (TIA), and cerebral infarction without residual deficits: Secondary | ICD-10-CM | POA: Insufficient documentation

## 2011-08-29 DIAGNOSIS — Z794 Long term (current) use of insulin: Secondary | ICD-10-CM | POA: Insufficient documentation

## 2011-08-29 DIAGNOSIS — R531 Weakness: Secondary | ICD-10-CM

## 2011-08-29 DIAGNOSIS — R634 Abnormal weight loss: Secondary | ICD-10-CM | POA: Insufficient documentation

## 2011-08-29 DIAGNOSIS — I2510 Atherosclerotic heart disease of native coronary artery without angina pectoris: Secondary | ICD-10-CM | POA: Insufficient documentation

## 2011-08-29 DIAGNOSIS — E119 Type 2 diabetes mellitus without complications: Secondary | ICD-10-CM | POA: Insufficient documentation

## 2011-08-29 DIAGNOSIS — I10 Essential (primary) hypertension: Secondary | ICD-10-CM | POA: Insufficient documentation

## 2011-08-29 DIAGNOSIS — Z79899 Other long term (current) drug therapy: Secondary | ICD-10-CM | POA: Insufficient documentation

## 2011-08-29 DIAGNOSIS — R5381 Other malaise: Secondary | ICD-10-CM | POA: Insufficient documentation

## 2011-08-29 DIAGNOSIS — F29 Unspecified psychosis not due to a substance or known physiological condition: Secondary | ICD-10-CM | POA: Insufficient documentation

## 2011-08-29 DIAGNOSIS — R413 Other amnesia: Secondary | ICD-10-CM | POA: Insufficient documentation

## 2011-08-29 HISTORY — DX: Congenital malformation of heart, unspecified: Q24.9

## 2011-08-29 LAB — BASIC METABOLIC PANEL
CO2: 29 mEq/L (ref 19–32)
Calcium: 9.2 mg/dL (ref 8.4–10.5)
GFR calc non Af Amer: >90 mL/min (ref >90)
Glucose, Bld: 179 mg/dL — ABNORMAL HIGH (ref 70–99)
Potassium: 4.4 mEq/L (ref 3.5–5.1)
Sodium: 139 mEq/L (ref 135–145)

## 2011-08-29 LAB — CBC
Platelets: 426 10*3/uL — ABNORMAL HIGH (ref 150–400)
RBC: 4.32 MIL/uL (ref 3.87–5.11)
RDW: 16.1 % — ABNORMAL HIGH (ref 11.5–15.5)
WBC: 7 10*3/uL (ref 4.0–10.5)

## 2011-08-29 LAB — DIFFERENTIAL
Basophils Absolute: 0 10*3/uL (ref 0.0–0.1)
Eosinophils Absolute: 0.3 10*3/uL (ref 0.0–0.7)
Eosinophils Relative: 5 % (ref 0–5)
Lymphocytes Relative: 22 % (ref 12–46)
Lymphs Abs: 1.6 10*3/uL (ref 0.7–4.0)
Neutrophils Relative %: 65 % (ref 43–77)

## 2011-08-29 LAB — URINE MICROSCOPIC-ADD ON

## 2011-08-29 LAB — URINALYSIS, ROUTINE W REFLEX MICROSCOPIC
Bilirubin Urine: NEGATIVE
Glucose, UA: NEGATIVE mg/dL
Ketones, ur: NEGATIVE mg/dL
Protein, ur: NEGATIVE mg/dL
pH: 7.5 (ref 5.0–8.0)

## 2011-08-29 LAB — PRO B NATRIURETIC PEPTIDE: Pro B Natriuretic peptide (BNP): 157.4 pg/mL — ABNORMAL HIGH (ref 0–125)

## 2011-08-29 LAB — CARDIAC PANEL(CRET KIN+CKTOT+MB+TROPI)
CK, MB: 1.8 ng/mL (ref 0.3–4.0)
Total CK: 120 U/L (ref 7–177)

## 2011-08-29 NOTE — Discharge Instructions (Signed)

## 2011-08-29 NOTE — ED Provider Notes (Addendum)
History     CSN: [id number]  Arrival date & time 08/29/11  1128   First MD Initiated Contact with Patient 08/29/11 1255      Chief Complaint  Patient presents with  . Fatigue    (Consider location/radiation/quality/duration/timing/severity/associated sxs/prior treatment) HPI Comments: Patient presents with generalized fatigue for the last 2-3 days.  Patient states that over the last 2-3 weeks she's been seen her cardiologist at [facility] who has increased her dose of Lasix to try and get some fluid off.  She's had approximately a 10 pound weight loss over the last week.  Patient had noted some feeling of heaviness in her chest from the fluid which is improving.  Patient denies any chest pain, palpitations or worsening shortness of breath.  She states that she just feels tired and she wants to lay around.  There is no focal weakness on one side of her body than the other.  No fevers, cough, and nausea, vomiting, diarrhea, melena, abdominal pain, dysuria, headaches or other complaints.  The daughter who lives with the patient does secondarily bony aside to tell me that she's concerned that her mother is showing some mild confusion and comprehension issues over the last few weeks.  She does have a history of a stroke in the last year.  She has noted some intermittent left sided weakness similar to her stroke symptoms.  They did see the neurologist week ago in the neurologist has ordered further testing.  Daughter is concerned about the symptoms.  Notably the patient is ambulating around the emergency department with minimal to no assistance  Patient is a 61 y.o. female presenting with weakness. The history is provided by the patient and a relative. No language interpreter was used.  Weakness The primary symptoms include memory loss. Primary symptoms do not include headaches, syncope, loss of consciousness, altered mental status, seizures, dizziness, visual change, paresthesias, focal weakness, loss of  sensation, speech change, fever, nausea or vomiting. The symptoms began 2 days ago. The symptoms are unchanged. The neurological symptoms are diffuse.  Additional symptoms include weakness.    Past Medical History  Diagnosis Date  . Colitis, collagenous   . Personal history of urinary calculi   . Arthropathy, unspecified, site unspecified   . Bronchospastic airway disease   . Unspecified essential hypertension   . Type II or unspecified type diabetes mellitus without mention of complication, not stated as uncontrolled   . Other specified disease of pancreas   . Arthritis   . Coronary artery disease   . 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 Stroke     Nov 2012    Past Surgical History  Procedure Date  . Tubal ligation   . Knee arthroscopy   . Left arm fracture     Family History  Problem Relation Age of Onset  . Diabetes Father     multiple family members  . Heart disease Father     brother, paternal aunt  . Colon cancer Neg Hx   . Lung cancer Father   . Leukemia Mother     History  Substance Use Topics  . Smoking status: Never Smoker   . Smokeless tobacco: Never Used  . Alcohol Use: No    OB History    Grav Para Term Preterm Abortions TAB SAB Ect Mult Living                  Review of  Systems  Constitutional: Positive for fatigue. Negative for fever and chills.  HENT: Negative.   Eyes: Negative.  Negative for discharge and redness.  Respiratory: Negative.  Negative for cough and shortness of breath.   Cardiovascular: Negative.  Negative for chest pain and syncope.  Gastrointestinal: Negative.  Negative for nausea, vomiting, abdominal pain and diarrhea.  Genitourinary: Negative.  Negative for dysuria and vaginal discharge.  Musculoskeletal: Negative.  Negative for back pain.  Skin: Negative.  Negative for color change and rash.  Neurological: Positive for weakness. Negative for dizziness, speech change, focal  weakness, seizures, loss of consciousness, syncope, headaches and paresthesias.  Hematological: Negative.  Negative for adenopathy.  Psychiatric/Behavioral: Positive for memory loss and confusion. Negative for altered mental status.  All other systems reviewed and are negative.    Allergies  Phenytoin  Home Medications   Current Outpatient Rx  Name Route Sig Dispense Refill  . AMYLASE-LIPASE-PROTEASE 65-20-65 MU PO CPEP Oral Take 1 capsule by mouth daily.     . ASPIRIN 81 MG PO TABS Oral Take 81 mg by mouth daily.     Marland Kitchen ESCITALOPRAM OXALATE 10 MG PO TABS Oral Take 10 mg by mouth daily.     . FUROSEMIDE 20 MG PO TABS Oral Take 20 mg by mouth daily.    . INSULIN ASPART 100 UNIT/ML [place] SOLN Subcutaneous Inject 0-20 Units into the skin 3 (three) times daily with meals. Take as directed based on CBG reading. Refer to discharge instructions. CBG 70 - 120: 0 units      CBG > 400 call MD and obtain STAT lab verification      CBG 121 - 150: 3 units      CBG 151 - 200: 4 units      CBG 201 - 250: 7 units      CBG 251 - 300: 11 units      CBG 301 - 350: 15 units      CBG 351 - 400: 20 units   1 pen 0  . INSULIN GLARGINE 100 UNIT/ML [place] SOLN Subcutaneous Inject 34 Units into the skin daily.     Marland Kitchen LISINOPRIL 5 MG PO TABS Oral Take 5 mg by mouth daily.    Marland Kitchen METFORMIN HCL 1000 MG PO TABS Oral Take 1,000 mg by mouth 2 (two) times daily with a meal.     . PANTOPRAZOLE SODIUM 40 MG PO TBEC Oral Take 40 mg by mouth daily.     Marland Kitchen SIMVASTATIN 20 MG PO TABS Oral Take 1 tablet (20 mg total) by mouth at bedtime. 30 tablet 0  . ZOLPIDEM TARTRATE 5 MG PO TABS Oral Take 1 tablet (5 mg total) by mouth at bedtime as needed for sleep. 10 tablet 0  . BEPOTASTINE BESILATE 1.5 % OP SOLN Both Eyes Place 2 drops into both eyes 2 (two) times daily as needed. Dry eyes       BP 119/75  Pulse 57  Temp(Src) 97.7 F (36.5 C) (Oral)  Resp 16  Ht 5' 1.5" (1.562 m)  SpO2 99%  Physical Exam  Nursing note and vitals  reviewed. Constitutional: She is oriented to person, place, and time. She appears well-developed and well-nourished.  Non-toxic appearance. She does not have a sickly appearance.  HENT:  Head: Normocephalic and atraumatic.  Eyes: Conjunctivae, EOM and lids are normal. Pupils are equal, round, and reactive to light. No scleral icterus.  Neck: Trachea normal and normal range of motion. Neck supple.  Cardiovascular: Normal rate,  regular rhythm and normal heart sounds.  Exam reveals no gallop and no friction rub.   No murmur heard. Pulmonary/Chest: Effort normal and breath sounds normal. No respiratory distress. She has no wheezes. She has no rales.  Abdominal: Soft. Normal appearance. There is no tenderness. There is no rebound, no guarding and no CVA tenderness.  Musculoskeletal: Normal range of motion. She exhibits no edema and no tenderness.  Neurological: She is alert and oriented to person, place, and time. She has normal strength.  Skin: Skin is warm, dry and intact. No rash noted.  Psychiatric: She has a normal mood and affect. Her behavior is normal. Judgment and thought content normal.    ED Course  Procedures (including critical care time)  Results for orders placed during the hospital encounter of 08/29/11  CBC      Component Value Range   WBC 7.0  4.0 - 10.5 (K/uL)   RBC 4.32  3.87 - 5.11 (MIL/uL)   Hemoglobin 12.5  12.0 - 15.0 (g/dL)   HCT 16.1 (*) 09.6 - 46.0 (%)   MCV 83.1  78.0 - 100.0 (fL)   MCH 28.9  26.0 - 34.0 (pg)   MCHC 34.8  30.0 - 36.0 (g/dL)   RDW 04.5 (*) 40.9 - 15.5 (%)   Platelets 426 (*) 150 - 400 (K/uL)  DIFFERENTIAL      Component Value Range   Neutrophils Relative 65  43 - 77 (%)   Neutro Abs 4.6  1.7 - 7.7 (K/uL)   Lymphocytes Relative 22  12 - 46 (%)   Lymphs Abs 1.6  0.7 - 4.0 (K/uL)   Monocytes Relative 7  3 - 12 (%)   Monocytes Absolute 0.5  0.1 - 1.0 (K/uL)   Eosinophils Relative 5  0 - 5 (%)   Eosinophils Absolute 0.3  0.0 - 0.7 (K/uL)    Basophils Relative 0  0 - 1 (%)   Basophils Absolute 0.0  0.0 - 0.1 (K/uL)  BASIC METABOLIC PANEL      Component Value Range   Sodium 139  135 - 145 (mEq/L)   Potassium 4.4  3.5 - 5.1 (mEq/L)   Chloride 101  96 - 112 (mEq/L)   CO2 29  19 - 32 (mEq/L)   Glucose, Bld 179 (*) 70 - 99 (mg/dL)   BUN 11  6 - 23 (mg/dL)   Creatinine, Ser 8.11  0.50 - 1.10 (mg/dL)   Calcium 9.2  8.4 - 91.4 (mg/dL)   GFR calc non Af Amer >90  >90 (mL/min)   GFR calc Af Amer >90  >90 (mL/min)  PRO B NATRIURETIC PEPTIDE      Component Value Range   Pro B Natriuretic peptide (BNP) 157.4 (*) 0 - 125 (pg/mL)  CARDIAC PANEL(CRET KIN+CKTOT+MB+TROPI)      Component Value Range   Total CK 120  7 - 177 (U/L)   CK, MB 1.8  0.3 - 4.0 (ng/mL)   Troponin I <0.30  <0.30 (ng/mL)   Relative Index 1.5  0.0 - 2.5   URINALYSIS, ROUTINE W REFLEX MICROSCOPIC      Component Value Range   Color, Urine YELLOW  YELLOW    APPearance CLOUDY (*) CLEAR    Specific Gravity, Urine 1.025  1.005 - 1.030    pH 7.5  5.0 - 8.0    Glucose, UA NEGATIVE  NEGATIVE (mg/dL)   Hgb urine dipstick NEGATIVE  NEGATIVE    Bilirubin Urine NEGATIVE  NEGATIVE    Ketones, ur  NEGATIVE  NEGATIVE (mg/dL)   Protein, ur NEGATIVE  NEGATIVE (mg/dL)   Urobilinogen, UA 1.0  0.0 - 1.0 (mg/dL)   Nitrite NEGATIVE  NEGATIVE    Leukocytes, UA TRACE (*) NEGATIVE   URINE MICROSCOPIC-ADD ON      Component Value Range   Squamous Epithelial / LPF FEW (*) RARE    WBC, UA 0-2  <3 (WBC/hpf)   Bacteria, UA FEW (*) RARE    Urine-Other MUCOUS PRESENT     Dg Chest 2 View  08/29/2011  *RADIOLOGY REPORT*  Clinical Data: Fatigue  CHEST - 2 VIEW  Comparison: 02/19/2011  Findings: Upper normal heart size.  Clear lungs.  No pneumothorax and no pleural effusion.  Degenerative changes in the spine.  No vertebral compression deformity.  IMPRESSION: Chronic changes.  No active cardiopulmonary disease.  Original Report Authenticated By: Donavan Burnet, M.D.   Ct Head Wo  Contrast  08/29/2011  *RADIOLOGY REPORT*  Clinical Data: Memory problems and recent stroke  CT HEAD WITHOUT CONTRAST  Technique:  Contiguous axial images were obtained from the base of the skull through the vertex without contrast.  Comparison: 02/19/2011  Findings: There is a new area of low density in the left basal ganglia posteriorly extending into the external capsule.  No mass effect, midline shift, or acute intracranial hemorrhage.  Mastoid air cells are clear.  Postop changes in the right maxillary sinus.  Cranium is intact.  IMPRESSION: New area of low density in the left basal ganglia consistent with a given history of recent stroke.  Age is indeterminate.  MRI can be performed to further delineate.  Original Report Authenticated By: Donavan Burnet, M.D.       Date: 08/29/2011  Rate: 67  Rhythm: normal sinus rhythm  QRS Axis: normal  Intervals: normal  ST/T Wave abnormalities: normal  Conduction Disutrbances:none  Narrative Interpretation:   Old EKG Reviewed: unchanged from 04/29/2011   MDM  On patient with unclear etiology for her generalized fatigue.  She has no acute signs of infection.  I will check for signs of CHF although clinically she seems improved.  Patient denies history of thyroid problems.  Regarding her daughter's concerns about confusion and comprehension issues these do not appear to be acute.  I will obtain a head CT to look for acute pathology in changes but expect that this will continue to be followed as an outpatient by her neurologist per the history of present illness.  Cardiologist at [facility] did contact me, Dr. Jeralyn Ruths, who notes that the patient had a recent echocardiogram with an ejection fraction of 50%, has a PFO, and had a nuclear stress test one month ago that was essentially negative.    Nat Christen, MD 08/29/11 [phone]  Patient without acute pathology found on this visit to explain the patient's generalized fatigue.  Patient has no acute signs of CHF  or ACS.  No MI.  Patient has no clinical symptoms for infection.  There is no acute changes on her head CT.  Patient had a left-sided stroke per the daughter so the findings on CT to correlate with her prior stroke.  No UTI.  On further discussion of the results with the patient and her daughter the daughter also notes that she's been having fatigue and has been seen by the primary care physician for this.  This leads me to believe that the fatigue is not just over the last few days but has been going on much longer.  I  believe patient is safe for discharge home as I cannot find any acute causes for the symptoms and have directed the patient to return to her primary care physician.  Nat Christen, MD 08/29/11 [phone]

## 2011-08-29 NOTE — ED Notes (Signed)
Pt taking lasix 20 mg daily to keep "swelling and fluid off" and because of "heaviness in chest from fluid". Pt has been taking lasix x3 weeks. Fatigue started Monday and has increased to present, now pt has no energy and "just wants to lay around."

## 2012-01-16 ENCOUNTER — Other Ambulatory Visit (HOSPITAL_COMMUNITY)

## 2012-01-26 IMAGING — CR DG CHEST 2V
2 series · 2 of 2 positions shown · non-contrast
Comparison: 05/21/2008

CLINICAL DATA: Weakness.

CHEST - 2 VIEW

[w chest lat]
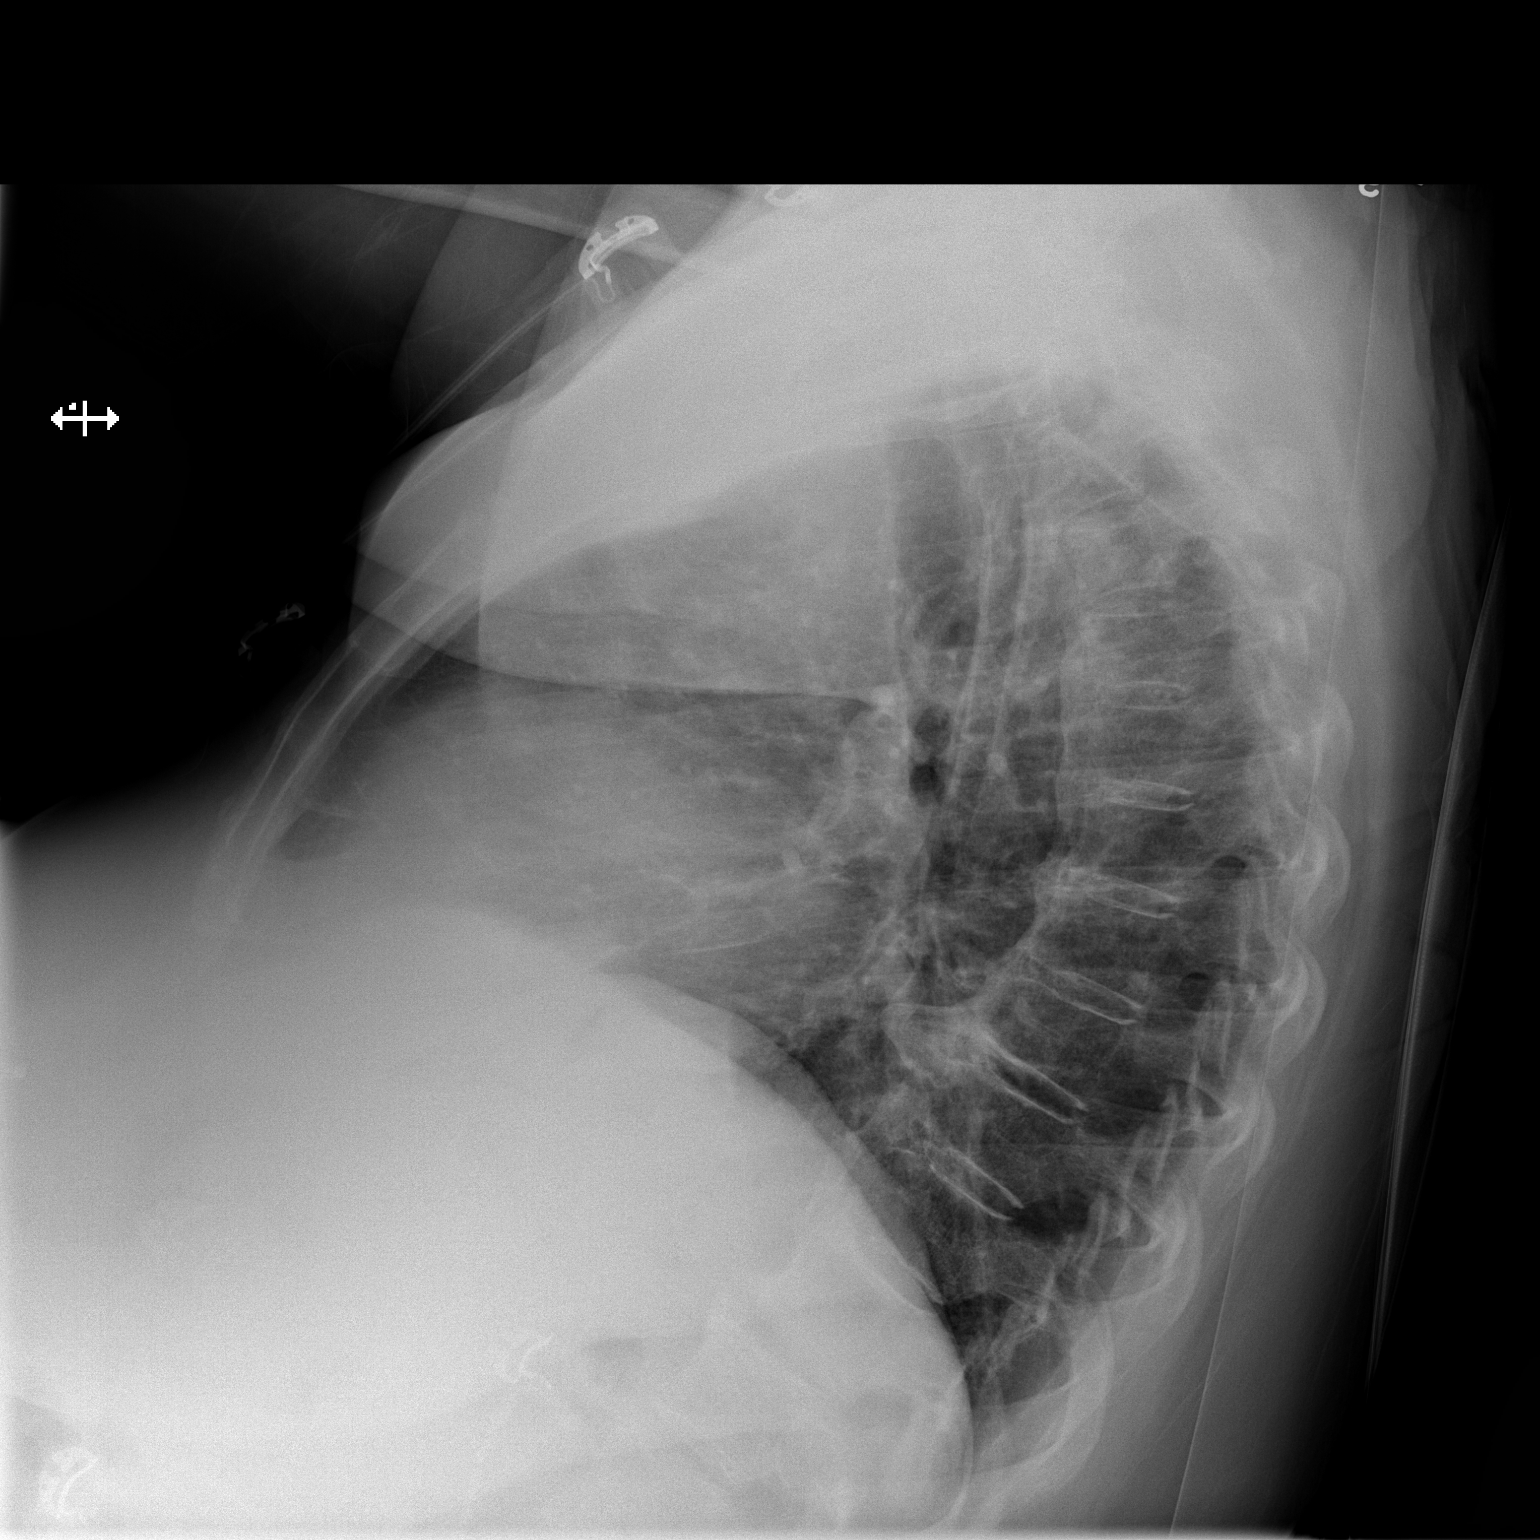

[x chest ap]
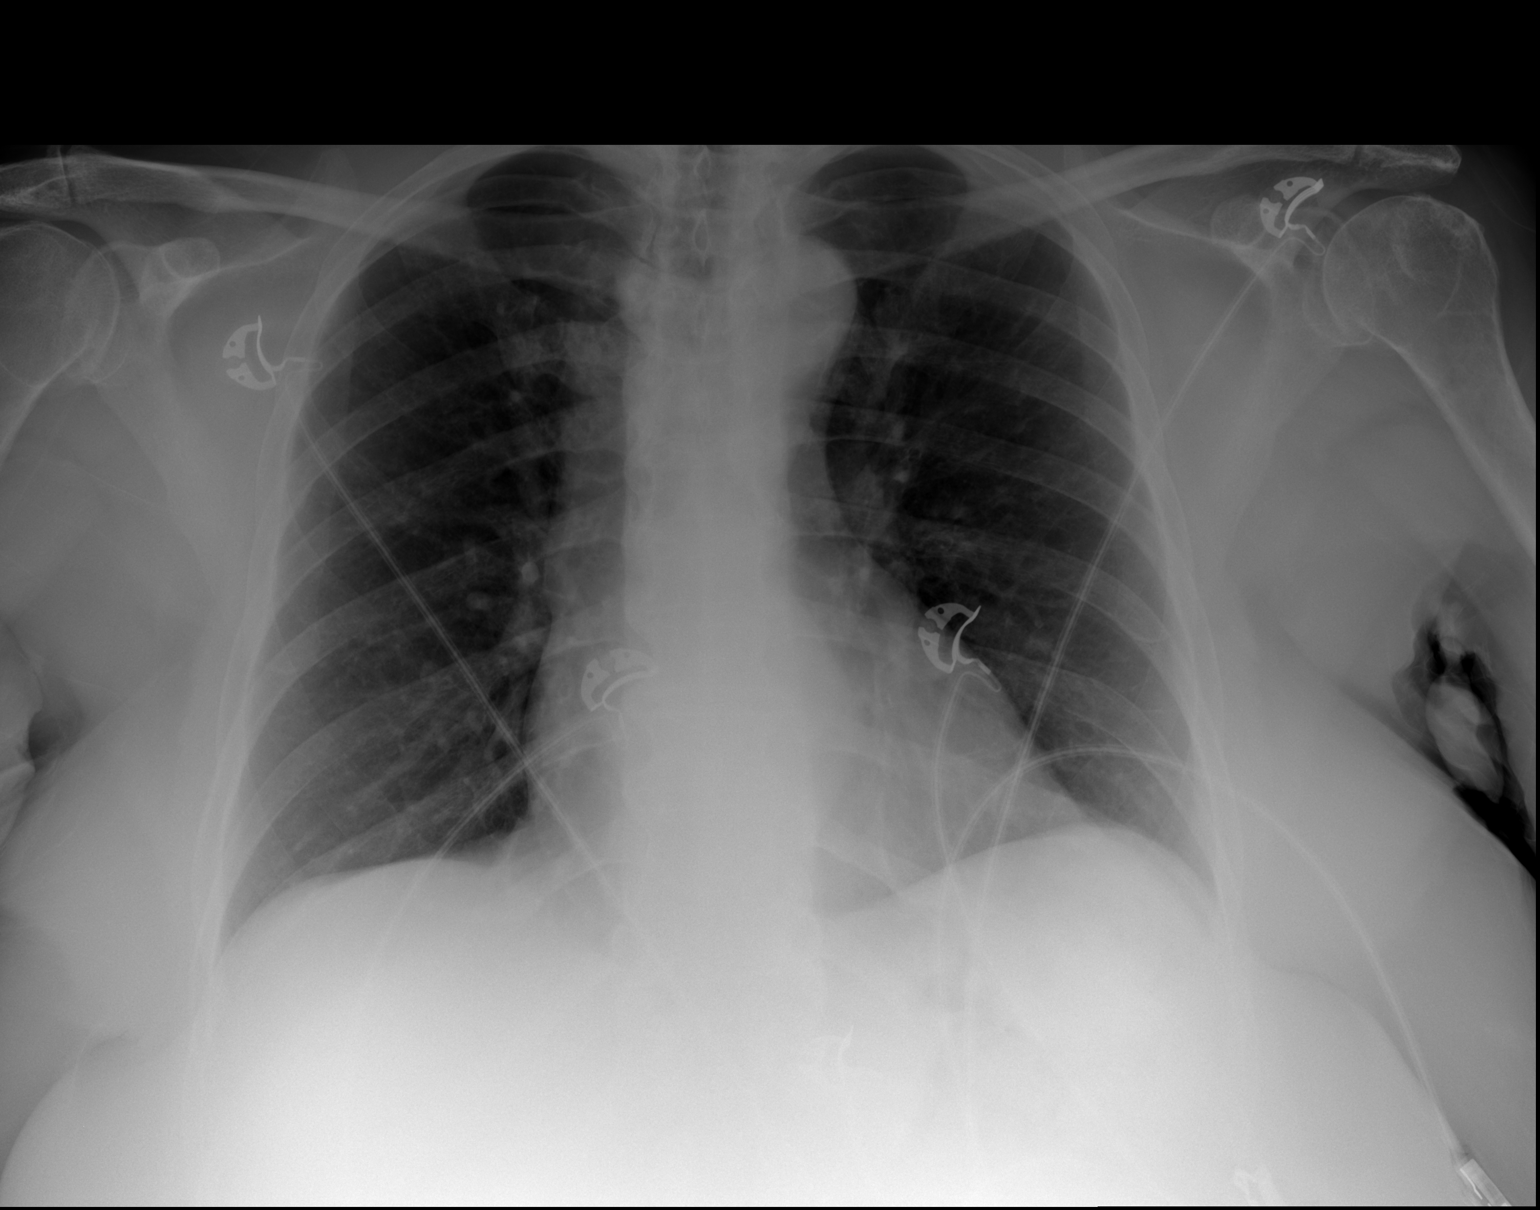

[2 of 2 positions shown; findings below may reference images not displayed]

FINDINGS: Normal sized heart.  Clear lungs.  Thoracic spine
degenerative changes.
IMPRESSION: No acute abnormality.

## 2012-07-26 ENCOUNTER — Encounter (HOSPITAL_COMMUNITY): Payer: Self-pay | Admitting: Emergency Medicine

## 2012-07-26 ENCOUNTER — Emergency Department (HOSPITAL_COMMUNITY)

## 2012-07-26 ENCOUNTER — Emergency Department (HOSPITAL_COMMUNITY)
Admission: EM | Admit: 2012-07-26 | Discharge: 2012-07-26 | Disposition: A | Discharge status: Home / Self Care | Attending: Emergency Medicine | Admitting: Emergency Medicine

## 2012-07-26 DIAGNOSIS — R110 Nausea: Secondary | ICD-10-CM | POA: Insufficient documentation

## 2012-07-26 DIAGNOSIS — Z8679 Personal history of other diseases of the circulatory system: Secondary | ICD-10-CM | POA: Insufficient documentation

## 2012-07-26 DIAGNOSIS — J45909 Unspecified asthma, uncomplicated: Secondary | ICD-10-CM | POA: Insufficient documentation

## 2012-07-26 DIAGNOSIS — R5381 Other malaise: Secondary | ICD-10-CM | POA: Insufficient documentation

## 2012-07-26 DIAGNOSIS — I2510 Atherosclerotic heart disease of native coronary artery without angina pectoris: Secondary | ICD-10-CM | POA: Insufficient documentation

## 2012-07-26 DIAGNOSIS — Z8719 Personal history of other diseases of the digestive system: Secondary | ICD-10-CM | POA: Insufficient documentation

## 2012-07-26 DIAGNOSIS — I10 Essential (primary) hypertension: Secondary | ICD-10-CM | POA: Insufficient documentation

## 2012-07-26 DIAGNOSIS — R269 Unspecified abnormalities of gait and mobility: Secondary | ICD-10-CM | POA: Insufficient documentation

## 2012-07-26 DIAGNOSIS — I509 Heart failure, unspecified: Secondary | ICD-10-CM | POA: Insufficient documentation

## 2012-07-26 DIAGNOSIS — Z794 Long term (current) use of insulin: Secondary | ICD-10-CM | POA: Insufficient documentation

## 2012-07-26 DIAGNOSIS — E669 Obesity, unspecified: Secondary | ICD-10-CM | POA: Insufficient documentation

## 2012-07-26 DIAGNOSIS — R0789 Other chest pain: Secondary | ICD-10-CM | POA: Insufficient documentation

## 2012-07-26 DIAGNOSIS — R630 Anorexia: Secondary | ICD-10-CM | POA: Insufficient documentation

## 2012-07-26 DIAGNOSIS — E119 Type 2 diabetes mellitus without complications: Secondary | ICD-10-CM | POA: Insufficient documentation

## 2012-07-26 DIAGNOSIS — R5383 Other fatigue: Secondary | ICD-10-CM

## 2012-07-26 DIAGNOSIS — Z7982 Long term (current) use of aspirin: Secondary | ICD-10-CM | POA: Insufficient documentation

## 2012-07-26 DIAGNOSIS — M129 Arthropathy, unspecified: Secondary | ICD-10-CM | POA: Insufficient documentation

## 2012-07-26 DIAGNOSIS — Z87442 Personal history of urinary calculi: Secondary | ICD-10-CM | POA: Insufficient documentation

## 2012-07-26 DIAGNOSIS — Z8673 Personal history of transient ischemic attack (TIA), and cerebral infarction without residual deficits: Secondary | ICD-10-CM | POA: Insufficient documentation

## 2012-07-26 DIAGNOSIS — Z79899 Other long term (current) drug therapy: Secondary | ICD-10-CM | POA: Insufficient documentation

## 2012-07-26 HISTORY — DX: Heart failure, unspecified: I50.9

## 2012-07-26 LAB — CBC WITH DIFFERENTIAL/PLATELET
Basophils Relative: 0 % (ref 0–1)
HCT: 37.9 % (ref 36.0–46.0)
Hemoglobin: 12.3 g/dL (ref 12.0–15.0)
Lymphocytes Relative: 20 % (ref 12–46)
MCHC: 32.5 g/dL (ref 30.0–36.0)
Monocytes Absolute: 0.4 10*3/uL (ref 0.1–1.0)
Monocytes Relative: 6 % (ref 3–12)
Neutro Abs: 4.5 10*3/uL (ref 1.7–7.7)

## 2012-07-26 LAB — BASIC METABOLIC PANEL
BUN: 7 mg/dL (ref 6–23)
CO2: 29 mEq/L (ref 19–32)
Chloride: 100 mEq/L (ref 96–112)
Creatinine, Ser: 0.57 mg/dL (ref 0.50–1.10)

## 2012-07-26 LAB — URINALYSIS, ROUTINE W REFLEX MICROSCOPIC
Bilirubin Urine: NEGATIVE
Hgb urine dipstick: NEGATIVE
Specific Gravity, Urine: 1.021 (ref 1.005–1.030)
pH: 7.5 (ref 5.0–8.0)

## 2012-07-26 LAB — URINE MICROSCOPIC-ADD ON

## 2012-07-26 LAB — POCT I-STAT TROPONIN I

## 2012-07-26 LAB — GLUCOSE, CAPILLARY

## 2012-07-26 MED ORDER — ONDANSETRON HCL 4 MG/2ML IJ SOLN
4.0000 mg | Freq: Once | INTRAMUSCULAR | Status: AC
  Administered 2012-07-26: 4 mg via INTRAVENOUS
  Filled 2012-07-26: qty 2

## 2012-07-26 MED ORDER — SODIUM CHLORIDE 0.9 % IV SOLN
INTRAVENOUS | Status: DC
  Administered 2012-07-26: 12:00:00 via INTRAVENOUS

## 2012-07-26 NOTE — ED Provider Notes (Signed)
History     CSN: [id number]  Arrival date & time 07/26/12  1100   First MD Initiated Contact with Patient 07/26/12 1113      Chief Complaint  Patient presents with  . Fatigue    (Consider location/radiation/quality/duration/timing/severity/associated sxs/prior treatment) HPI Comments: Pt with past medical history significant for hypertension, diabetes, congestive heart failure, hyperlipidemia, stroke, presents to the ED for generalized fatigue x 2 weeks.  States she feels very run down with low energy and is now having a decreased appetite with intermittent nausea.  Also endorses some intermittent chest pain, described as a tightness beginning on the right side of her chest and radiating to the left. Chest pain does not seem to be related to exertion and is not associated with SOB, diaphoresis, dizziness, or syncope. Patient also states her blood sugars have been fluctuating lately.  Patient's weight has been stable and LE edema has been minimal. She does note at times she feels off balance and unsteady on her feet.  Has been taking all medications as directed.  Patient did receive a flu shot earlier this year.  Denies any shortness of breath, vomiting, diarrhea, dysuria, weakness, dizziness, altered mental status, or confusion.  No recent travel, surgeries, or extended periods of immobilization.  The history is provided by the patient.    Past Medical History  Diagnosis Date  . Other and unspecified noninfectious gastroenteritis and colitis   . Personal history of urinary calculi   . Arthropathy, unspecified, site unspecified   . Unspecified asthma   . Unspecified essential hypertension   . Type II or unspecified type diabetes mellitus without mention of complication, not stated as uncontrolled   . Other specified disease of pancreas   . Arthritis   . Coronary artery disease   . 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 Stroke     Nov 2012     Past Surgical History  Procedure Laterality Date  . Tubal ligation    . Knee arthroscopy    . Left arm fracture      Family History  Problem Relation Age of Onset  . Diabetes Father     multiple family members  . Heart disease Father     brother, paternal aunt  . Colon cancer Neg Hx   . Lung cancer Father   . Leukemia Mother     History  Substance Use Topics  . Smoking status: Never Smoker   . Smokeless tobacco: Never Used  . Alcohol Use: No    OB History   Grav Para Term Preterm Abortions TAB SAB Ect Mult Living                  Review of Systems  Constitutional: Positive for appetite change and fatigue.  Cardiovascular: Positive for chest pain.  All other systems reviewed and are negative.    Allergies  Phenytoin  Home Medications   Current Outpatient Rx  Name  Route  Sig  Dispense  Refill  . amylase-lipase-protease (PANGESTYME UL 20) 65-20-65 MU per capsule   Oral   Take 1 capsule by mouth daily.          Marland Kitchen aspirin 81 MG tablet   Oral   Take 81 mg by mouth daily.          . Bepotastine Besilate (BEPREVE) 1.5 % SOLN   Both Eyes   Place 2 drops into both eyes 2 (two) times  daily as needed. Dry eyes          . escitalopram (LEXAPRO) 10 MG tablet   Oral   Take 10 mg by mouth daily.          . furosemide (LASIX) 20 MG tablet   Oral   Take 20 mg by mouth daily.         Marland Kitchen EXPIRED: insulin aspart (NOVOLOG) 100 UNIT/ML injection   Subcutaneous   Inject 0-20 Units into the skin 3 (three) times daily with meals. Take as directed based on CBG reading. Refer to discharge instructions. CBG 70 - 120: 0 units      CBG > 400 call MD and obtain STAT lab verification      CBG 121 - 150: 3 units      CBG 151 - 200: 4 units      CBG 201 - 250: 7 units      CBG 251 - 300: 11 units      CBG 301 - 350: 15 units      CBG 351 - 400: 20 units     1 pen   0   . insulin glargine (LANTUS) 100 UNIT/ML injection   Subcutaneous   Inject 34 Units  into the skin daily.          Marland Kitchen lisinopril (PRINIVIL,ZESTRIL) 5 MG tablet   Oral   Take 5 mg by mouth daily.         . metFORMIN (GLUCOPHAGE) 1000 MG tablet   Oral   Take 1,000 mg by mouth 2 (two) times daily with a meal.          . pantoprazole (PROTONIX) 40 MG tablet   Oral   Take 40 mg by mouth daily.          Marland Kitchen EXPIRED: simvastatin (ZOCOR) 20 MG tablet   Oral   Take 1 tablet (20 mg total) by mouth at bedtime.   30 tablet   0   . EXPIRED: zolpidem (AMBIEN) 5 MG tablet   Oral   Take 1 tablet (5 mg total) by mouth at bedtime as needed for sleep.   10 tablet   0     BP 162/80  Pulse 56  Temp(Src) 98.3 F (36.8 C) (Oral)  Resp 20  Wt 219 lb (99.338 kg)  BMI 40.71 kg/m2  Physical Exam  Nursing note and vitals reviewed. Constitutional: She is oriented to person, place, and time. She appears well-developed and well-nourished.  obese  HENT:  Head: Normocephalic and atraumatic.  Right Ear: Tympanic membrane and ear canal normal.  Left Ear: Tympanic membrane and ear canal normal.  Nose: Nose normal.  Mouth/Throat: Uvula is midline and mucous membranes are normal. No oropharyngeal exudate, posterior oropharyngeal edema, posterior oropharyngeal erythema or tonsillar abscesses.  Eyes: Conjunctivae and EOM are normal. Pupils are equal, round, and reactive to light.  Neck: Normal range of motion. Neck supple. No spinous process tenderness and no muscular tenderness present. No rigidity.  Cardiovascular: Normal rate, regular rhythm and normal heart sounds.   Pulmonary/Chest: Effort normal and breath sounds normal.  Chest pain not reproducible with palpation to left chest wall  Abdominal: Soft. Bowel sounds are normal. There is no tenderness. There is no guarding.  Musculoskeletal: Normal range of motion.  Neurological: She is alert and oriented to person, place, and time. She has normal strength. No cranial nerve deficit or sensory deficit.  Equal strength UE and LE  bilaterally; no neuro deficits or  facial droop appreciated  Skin: Skin is warm and dry.  Psychiatric: She has a normal mood and affect. Her speech is normal.    ED Course  Procedures (including critical care time)   Date: 07/26/2012  Rate: 56  Rhythm: normal sinus rhythm  QRS Axis: normal  Intervals: normal  ST/T Wave abnormalities: normal  Conduction Disutrbances:none  Narrative Interpretation: normal EKG, no STEMI  Old EKG Reviewed: unchanged    Labs Reviewed  CBC WITH DIFFERENTIAL - Abnormal; Notable for the following:    Platelets 505 (*)    All other components within normal limits  BASIC METABOLIC PANEL - Abnormal; Notable for the following:    Glucose, Bld 209 (*)    All other components within normal limits  URINALYSIS, ROUTINE W REFLEX MICROSCOPIC - Abnormal; Notable for the following:    Leukocytes, UA SMALL (*)    All other components within normal limits  GLUCOSE, CAPILLARY - Abnormal; Notable for the following:    Glucose-Capillary 211 (*)    All other components within normal limits  URINE CULTURE  PRO B NATRIURETIC PEPTIDE  URINE MICROSCOPIC-ADD ON  POCT I-STAT TROPONIN I   Dg Chest 2 View  07/26/2012  *RADIOLOGY REPORT*  Clinical Data: 05/1937.  CHEST - 2 VIEW  Comparison: 08/29/2011  Findings: Tortuosity of the thoracic aorta.  Heart mediastinal contours otherwise within normal limits.  Lungs are clear.  No effusions.  Degenerative changes in the thoracic spine.  IMPRESSION: No acute cardiopulmonary disease.   Original Report Authenticated By: Charlett Nose, M.D.      1. Fatigue       MDM   Patient presenting to the ED for generalized fatigue x 2 weeks.  Also endorses some unsteadiness on her feet and intermittent chest pain without associated SOB, diaphoresis, palpitations, or dizziness.  No recent travel, surgeries, or periods of immobilization. Hx of CHF and LE edema has been minimal.  Workup in the ED negative and without explainable cause of her  fatigue.  The patient was evaluated by Dr. Freida Busman and further workup is not indicated at this time. Patient will followup with her primary care physician Dr. Sherryll Burger.  Discussed plan with pt- she agreed.  Return precautions advised.        Garlon Hatchet, PA-C 07/26/12 [phone]

## 2012-07-26 NOTE — ED Provider Notes (Signed)
Medical screening examination/treatment/procedure(s) were conducted as a shared visit with non-physician practitioner(s) and myself.  I personally evaluated the patient during the encounter  Pt here with nonfocal weakness for 2 weeks. No recent illnesses. Workup here negative. Patient called her doctor. Neurological exam stable  Toy Baker, MD 07/26/12 1432

## 2012-07-26 NOTE — ED Notes (Signed)
Patient with fatigue and not feeling well.  She reports no chest pain just feeling full in her chest and then described it as tightness.    She has history of CHF, stroke, diabetes, and hypertension.  Her balance has been off, and decreased appetite.  Blood sugars have been fluctuating more lately.

## 2012-07-28 LAB — URINE CULTURE

## 2012-07-29 NOTE — ED Provider Notes (Signed)
Medical screening examination/treatment/procedure(s) were performed by non-physician practitioner and as supervising physician I was immediately available for consultation/collaboration.  Rhoderick Farrel T Serah Nicoletti, MD 07/29/12 1308 

## 2013-01-12 ENCOUNTER — Ambulatory Visit
Admission: RE | Admit: 2013-01-12 | Discharge: 2013-01-12 | Disposition: A | Discharge status: Home / Self Care | Source: Ambulatory Visit | Attending: Family Medicine | Admitting: Family Medicine

## 2013-01-12 ENCOUNTER — Other Ambulatory Visit: Payer: Self-pay | Admitting: Family Medicine

## 2013-01-12 DIAGNOSIS — M25561 Pain in right knee: Secondary | ICD-10-CM

## 2015-10-02 ENCOUNTER — Emergency Department (HOSPITAL_COMMUNITY): Payer: Medicare Other

## 2015-10-02 ENCOUNTER — Emergency Department (HOSPITAL_COMMUNITY)
Admission: EM | Admit: 2015-10-02 | Discharge: 2015-10-02 | Disposition: A | Discharge status: Home / Self Care | Payer: Medicare Other | Attending: Emergency Medicine | Admitting: Emergency Medicine

## 2015-10-02 ENCOUNTER — Encounter (HOSPITAL_COMMUNITY): Payer: Self-pay | Admitting: Emergency Medicine

## 2015-10-02 DIAGNOSIS — M25561 Pain in right knee: Secondary | ICD-10-CM

## 2015-10-02 DIAGNOSIS — Z79899 Other long term (current) drug therapy: Secondary | ICD-10-CM | POA: Insufficient documentation

## 2015-10-02 DIAGNOSIS — I110 Hypertensive heart disease with heart failure: Secondary | ICD-10-CM | POA: Insufficient documentation

## 2015-10-02 DIAGNOSIS — Y929 Unspecified place or not applicable: Secondary | ICD-10-CM | POA: Diagnosis not present

## 2015-10-02 DIAGNOSIS — J45909 Unspecified asthma, uncomplicated: Secondary | ICD-10-CM | POA: Diagnosis not present

## 2015-10-02 DIAGNOSIS — Z8673 Personal history of transient ischemic attack (TIA), and cerebral infarction without residual deficits: Secondary | ICD-10-CM | POA: Insufficient documentation

## 2015-10-02 DIAGNOSIS — I509 Heart failure, unspecified: Secondary | ICD-10-CM | POA: Diagnosis not present

## 2015-10-02 DIAGNOSIS — M25562 Pain in left knee: Secondary | ICD-10-CM | POA: Diagnosis not present

## 2015-10-02 DIAGNOSIS — Z7982 Long term (current) use of aspirin: Secondary | ICD-10-CM | POA: Diagnosis not present

## 2015-10-02 DIAGNOSIS — I2510 Atherosclerotic heart disease of native coronary artery without angina pectoris: Secondary | ICD-10-CM | POA: Diagnosis not present

## 2015-10-02 DIAGNOSIS — E119 Type 2 diabetes mellitus without complications: Secondary | ICD-10-CM | POA: Diagnosis not present

## 2015-10-02 DIAGNOSIS — M1990 Unspecified osteoarthritis, unspecified site: Secondary | ICD-10-CM | POA: Insufficient documentation

## 2015-10-02 DIAGNOSIS — W010XXA Fall on same level from slipping, tripping and stumbling without subsequent striking against object, initial encounter: Secondary | ICD-10-CM | POA: Diagnosis not present

## 2015-10-02 DIAGNOSIS — Y939 Activity, unspecified: Secondary | ICD-10-CM | POA: Diagnosis not present

## 2015-10-02 DIAGNOSIS — Z7984 Long term (current) use of oral hypoglycemic drugs: Secondary | ICD-10-CM | POA: Diagnosis not present

## 2015-10-02 DIAGNOSIS — Y999 Unspecified external cause status: Secondary | ICD-10-CM | POA: Diagnosis not present

## 2015-10-02 DIAGNOSIS — W19XXXA Unspecified fall, initial encounter: Secondary | ICD-10-CM

## 2015-10-02 MED ORDER — NAPROXEN 500 MG PO TABS: 500.0000 mg | ORAL_TABLET | Freq: Two times a day (BID) | ORAL | Status: DC

## 2015-10-02 MED ORDER — IBUPROFEN 200 MG PO TABS
600.0000 mg | ORAL_TABLET | Freq: Once | ORAL | Status: AC
  Administered 2015-10-02: 600 mg via ORAL
  Filled 2015-10-02: qty 3

## 2015-10-02 NOTE — ED Provider Notes (Signed)
CSN: [id number]     Arrival date & time 10/02/15  1904 History   First MD Initiated Contact with Patient 10/02/15 1935     Chief Complaint  Patient presents with  . Fall     (Consider location/radiation/quality/duration/timing/severity/associated sxs/prior Treatment) HPI   Vanessa [name] is a 65 y.o F with Past medical history of DM, CAD who presents to the ED today to be evaluated after a fall. Patient states that she was getting out of the car in a parking lot earlier today and she was wearing high heels. She states her high heels, caught on the edge of the car door so she tripped and fell landing on her left side. She denies hitting her head or losing consciousness. She was able to ambulate after the fall. She is now complaining of bilateral knee pain. She took appropriate for her symptoms with minimal relief. Patient is not anticoagulated.  Past Medical History  Diagnosis Date  . Other and unspecified noninfectious gastroenteritis and colitis(558.9)   . Personal history of urinary calculi   . Arthropathy, unspecified, site unspecified   . Unspecified asthma(493.90)   . Unspecified essential hypertension   . Type II or unspecified type diabetes mellitus without mention of complication, not stated as uncontrolled   . Other specified disease of pancreas   . Arthritis   . Coronary artery disease   . 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 Stroke [facility](HCC)     Nov 2012  . Congestive cardiac failure [facility](HCC)    Past Surgical History  Procedure Laterality Date  . Tubal ligation    . Knee arthroscopy    . Left arm fracture     Family History  Problem Relation Age of Onset  . Diabetes Father     multiple family members  . Heart disease Father     brother, paternal aunt  . Colon cancer Neg Hx   . Lung cancer Father   . Leukemia Mother    Social History  Substance Use Topics  . Smoking status: Never Smoker   . Smokeless tobacco: Never Used   . Alcohol Use: No   OB History    No data available     Review of Systems  All other systems reviewed and are negative.     Allergies  Phenytoin  Home Medications   Prior to Admission medications   Medication Sig Start Date End Date Taking? Authorizing Provider  aspirin 81 MG tablet Take 81 mg by mouth daily.     Historical Provider, MD  Bepotastine Besilate (BEPREVE) 1.5 % SOLN Place 2 drops into both eyes 2 (two) times daily as needed. Dry eyes     Historical Provider, MD  carvedilol (COREG) 12.5 MG tablet Take 12.5 mg by mouth 2 (two) times daily with a meal.    Historical Provider, MD  furosemide (LASIX) 20 MG tablet Take 20 mg by mouth daily.    Historical Provider, MD  insulin aspart (NOVOLOG) 100 UNIT/ML injection Inject 0-20 Units into the skin 3 (three) times daily with meals. Take as directed based on CBG reading. Refer to discharge instructions. CBG 70 - 120: 0 units      CBG > 400 call MD and obtain STAT lab verification      CBG 121 - 150: 3 units      CBG 151 - 200: 4 units      CBG 201 - 250: 7  units      CBG 251 - 300: 11 units      CBG 301 - 350: 15 units      CBG 351 - 400: 20 units   02/22/11 07/26/12  Gwenyth BenderKaren M Black, NP  insulin glargine (LANTUS) 100 UNIT/ML injection Inject 34 Units into the skin daily.     Historical Provider, MD  lisinopril (PRINIVIL,ZESTRIL) 5 MG tablet Take 5 mg by mouth daily.    Historical Provider, MD  metFORMIN (GLUCOPHAGE) 1000 MG tablet Take 1,000 mg by mouth 2 (two) times daily with a meal.     Historical Provider, MD  simvastatin (ZOCOR) 20 MG tablet Take 1 tablet (20 mg total) by mouth at bedtime. 02/22/11 07/26/12  Gwenyth BenderKaren M Black, NP  [place] Wort 300 MG TABS Take 300 mg by mouth.    Historical Provider, MD  zolpidem (AMBIEN) 5 MG tablet Take 5 mg by mouth at bedtime as needed for sleep.    Historical Provider, MD   BP 174/79 mmHg  Pulse 81  Temp(Src) 99.2 F (37.3 C) (Oral)  Resp 16  Ht 5' 1.5" (1.562 m)  Wt 92.987 kg   BMI 38.11 kg/m2  SpO2 100% Physical Exam  Constitutional: She is oriented to person, place, and time. She appears well-developed and well-nourished. No distress.  HENT:  Head: Normocephalic and atraumatic.  No battles sign. No racoon eyes. No hemotympanum.   Eyes: Conjunctivae are normal. Right eye exhibits no discharge. Left eye exhibits no discharge. No scleral icterus.  Neck: Normal range of motion. Neck supple.  Cardiovascular: Normal rate.   Pulmonary/Chest: Effort normal.  Musculoskeletal:  Negative anterior/poster drawer bilaterally. Negative ballottement test. No varus or valgus laxity. Mild crepitus of left knee. No pain with flexion or extension bilaterally. Small superficial abrasion over patella of R knee.   No TTP of bilateral hips and no pain with ROM.  No midline spinal tenderness. FROM of C, T, L spine. No step offs or obvious bony deformities.    Neurological: She is alert and oriented to person, place, and time. Coordination normal.  Skin: Skin is warm and dry. No rash noted. She is not diaphoretic. No erythema. No pallor.  Psychiatric: She has a normal mood and affect. Her behavior is normal.  Nursing note and vitals reviewed.   ED Course  Procedures (including critical care time) Labs Review Labs Reviewed - No data to display  Imaging Review No results found. I have personally reviewed and evaluated these images and lab results as part of my medical decision-making.   EKG Interpretation None      MDM   Final diagnoses:  Fall, initial encounter  Arthralgia of both knees    65 y.o F presents to the ED c/o of bilateral knee pain after a mechanical fall. No other trauma or injury. No anticoagulation. Pt appears well inED, in NAD. Patient X-Ray negative for obvious fracture or dislocation. Pain managed in ED. Pt has scheduled follow up with her orthopedic provider next week. Patient given brace while in ED, conservative therapy recommended and discussed. Pt  is ambulatory in the ED without difficulty.Patient will be dc home & is agreeable with above plan.    Lester KinsmanSamantha Tripp [place] ColonyDow[place], PA-C 10/06/15 1351  Lyndal Pulleyaniel Knott, MD 10/10/15 [phone]

## 2015-10-02 NOTE — ED Notes (Signed)
Patient was trying to get out of car and end falling. She scraped the right knee and the left knee is in pain. Patient did not hurt anything else.

## 2015-10-02 NOTE — ED Notes (Signed)
Pt reports understanding of discharge information. No questions at time of discharge 

## 2015-10-02 NOTE — Discharge Instructions (Signed)
Cryotherapy °Cryotherapy means treatment with cold. Ice or gel packs can be used to reduce both pain and swelling. Ice is the most helpful within the first 24 to 48 hours after an injury or flare-up from overusing a muscle or joint. Sprains, strains, spasms, burning pain, shooting pain, and aches can all be eased with ice. Ice can also be used when recovering from surgery. Ice is effective, has very few side effects, and is safe for most people to use. °PRECAUTIONS  °Ice is not a safe treatment option for people with: °· Raynaud phenomenon. This is a condition affecting small blood vessels in the extremities. Exposure to cold may cause your problems to return. °· Cold hypersensitivity. There are many forms of cold hypersensitivity, including: °· Cold urticaria. Red, itchy hives appear on the skin when the tissues begin to warm after being iced. °· Cold erythema. This is a red, itchy rash caused by exposure to cold. °· Cold hemoglobinuria. Red blood cells break down when the tissues begin to warm after being iced. The hemoglobin that carry oxygen are passed into the urine because they cannot combine with blood proteins fast enough. °· Numbness or altered sensitivity in the area being iced. °If you have any of the following conditions, do not use ice until you have discussed cryotherapy with your caregiver: °· Heart conditions, such as arrhythmia, angina, or chronic heart disease. °· High blood pressure. °· Healing wounds or open skin in the area being iced. °· Current infections. °· Rheumatoid arthritis. °· Poor circulation. °· Diabetes. °Ice slows the blood flow in the region it is applied. This is beneficial when trying to stop inflamed tissues from spreading irritating chemicals to surrounding tissues. However, if you expose your skin to cold temperatures for too long or without the proper protection, you can damage your skin or nerves. Watch for signs of skin damage due to cold. °HOME CARE INSTRUCTIONS °Follow  these tips to use ice and cold packs safely. °· Place a dry or damp towel between the ice and skin. A damp towel will cool the skin more quickly, so you may need to shorten the time that the ice is used. °· For a more rapid response, add gentle compression to the ice. °· Ice for no more than 10 to 20 minutes at a time. The bonier the area you are icing, the less time it will take to get the benefits of ice. °· Check your skin after 5 minutes to make sure there are no signs of a poor response to cold or skin damage. °· Rest 20 minutes or more between uses. °· Once your skin is numb, you can end your treatment. You can test numbness by very lightly touching your skin. The touch should be so light that you do not see the skin dimple from the pressure of your fingertip. When using ice, most people will feel these normal sensations in this order: cold, burning, aching, and numbness. °· Do not use ice on someone who cannot communicate their responses to pain, such as small children or people with dementia. °HOW TO MAKE AN ICE PACK °Ice packs are the most common way to use ice therapy. Other methods include ice massage, ice baths, and cryosprays. Muscle creams that cause a cold, tingly feeling do not offer the same benefits that ice offers and should not be used as a substitute unless recommended by your caregiver. °To make an ice pack, do one of the following: °· Place crushed ice or a   bag of frozen vegetables in a sealable plastic bag. Squeeze out the excess air. Place this bag inside another plastic bag. Slide the bag into a pillowcase or place a damp towel between your skin and the bag.  Mix 3 parts water with 1 part rubbing alcohol. Freeze the mixture in a sealable plastic bag. When you remove the mixture from the freezer, it will be slushy. Squeeze out the excess air. Place this bag inside another plastic bag. Slide the bag into a pillowcase or place a damp towel between your skin and the bag. SEEK MEDICAL CARE  IF:  You develop white spots on your skin. This may give the skin a blotchy (mottled) appearance.  Your skin turns blue or pale.  Your skin becomes waxy or hard.  Your swelling gets worse. MAKE SURE YOU:   Understand these instructions.  Will watch your condition.  Will get help right away if you are not doing well or get worse.   This information is not intended to replace advice given to you by your health care provider. Make sure you discuss any questions you have with your health care provider.   Document Released: 11/20/2010 Document Revised: 04/16/2014 Document Reviewed: 11/20/2010 Elsevier Interactive Patient Education 2016 [employer]  Knee Pain Knee pain is a very common symptom and can have many causes. Knee pain often goes away when you follow your health care provider's instructions for relieving pain and discomfort at home. However, knee pain can develop into a condition that needs treatment. Some conditions may include:  Arthritis caused by wear and tear (osteoarthritis).  Arthritis caused by swelling and irritation (rheumatoid arthritis or gout).  A cyst or growth in your knee.  An infection in your knee joint.  An injury that will not heal.  Damage, swelling, or irritation of the tissues that support your knee (torn ligaments or tendinitis). If your knee pain continues, additional tests may be ordered to diagnose your condition. Tests may include X-rays or other imaging studies of your knee. You may also need to have fluid removed from your knee. Treatment for ongoing knee pain depends on the cause, but treatment may include:  Medicines to relieve pain or swelling.  Steroid injections in your knee.  Physical therapy.  Surgery. HOME CARE INSTRUCTIONS  Take medicines only as directed by your health care provider.  Rest your knee and keep it raised (elevated) while you are resting.  Do not do things that cause or worsen pain.  Avoid high-impact  activities or exercises, such as running, jumping rope, or doing jumping jacks.  Apply ice to the knee area:  Put ice in a plastic bag.  Place a towel between your skin and the bag.  Leave the ice on for 20 minutes, 2-3 times a day.  Ask your health care provider if you should wear an elastic knee support.  Keep a pillow under your knee when you sleep.  Lose weight if you are overweight. Extra weight can put pressure on your knee.  Do not use any tobacco products, including cigarettes, chewing tobacco, or electronic cigarettes. If you need help quitting, ask your health care provider. Smoking may slow the healing of any bone and joint problems that you may have. SEEK MEDICAL CARE IF:  Your knee pain continues, changes, or gets worse.  You have a fever along with knee pain.  Your knee buckles or locks up.  Your knee becomes more swollen. SEEK IMMEDIATE MEDICAL CARE IF:   Your knee  joint feels hot to the touch.  You have chest pain or trouble breathing.   This information is not intended to replace advice given to you by your health care provider. Make sure you discuss any questions you have with your health care provider.   Keep scheduled appointment with your orthopedic provider for Tuesday of next week. Take Naprosyn as needed for pain. Apply ice to affected area. Return to the ED if you experience severe worsening of your pain, increased redness or swelling around your knees, loss of consciousness, chest pain or shortness of breath.

## 2015-12-23 LAB — GLUCOSE, POCT (MANUAL RESULT ENTRY): POC Glucose: 244 mg/dl — AB (ref 70–99)

## 2016-04-11 DIAGNOSIS — R69 Illness, unspecified: Secondary | ICD-10-CM | POA: Diagnosis not present

## 2016-04-17 DIAGNOSIS — R6881 Early satiety: Secondary | ICD-10-CM | POA: Diagnosis not present

## 2016-04-17 DIAGNOSIS — R1013 Epigastric pain: Secondary | ICD-10-CM | POA: Diagnosis not present

## 2016-04-17 DIAGNOSIS — K922 Gastrointestinal hemorrhage, unspecified: Secondary | ICD-10-CM | POA: Diagnosis not present

## 2016-04-17 DIAGNOSIS — K3189 Other diseases of stomach and duodenum: Secondary | ICD-10-CM | POA: Diagnosis not present

## 2016-05-17 DIAGNOSIS — L603 Nail dystrophy: Secondary | ICD-10-CM | POA: Diagnosis not present

## 2016-05-17 DIAGNOSIS — I739 Peripheral vascular disease, unspecified: Secondary | ICD-10-CM | POA: Diagnosis not present

## 2016-05-31 DIAGNOSIS — H52223 Regular astigmatism, bilateral: Secondary | ICD-10-CM | POA: Diagnosis not present

## 2016-05-31 DIAGNOSIS — H401133 Primary open-angle glaucoma, bilateral, severe stage: Secondary | ICD-10-CM | POA: Diagnosis not present

## 2016-05-31 DIAGNOSIS — H5203 Hypermetropia, bilateral: Secondary | ICD-10-CM | POA: Diagnosis not present

## 2016-06-05 DIAGNOSIS — Z23 Encounter for immunization: Secondary | ICD-10-CM | POA: Diagnosis not present

## 2016-06-05 DIAGNOSIS — E1165 Type 2 diabetes mellitus with hyperglycemia: Secondary | ICD-10-CM | POA: Diagnosis not present

## 2016-06-05 DIAGNOSIS — R69 Illness, unspecified: Secondary | ICD-10-CM | POA: Diagnosis not present

## 2016-06-05 DIAGNOSIS — M1710 Unilateral primary osteoarthritis, unspecified knee: Secondary | ICD-10-CM | POA: Diagnosis not present

## 2016-06-05 DIAGNOSIS — E119 Type 2 diabetes mellitus without complications: Secondary | ICD-10-CM | POA: Diagnosis not present

## 2016-06-05 DIAGNOSIS — I10 Essential (primary) hypertension: Secondary | ICD-10-CM | POA: Diagnosis not present

## 2016-07-13 DIAGNOSIS — H524 Presbyopia: Secondary | ICD-10-CM | POA: Diagnosis not present

## 2016-09-06 ENCOUNTER — Encounter (HOSPITAL_COMMUNITY): Payer: Self-pay

## 2016-09-06 ENCOUNTER — Emergency Department (HOSPITAL_COMMUNITY)
Admission: EM | Admit: 2016-09-06 | Discharge: 2016-09-07 | Disposition: A | Discharge status: Home / Self Care | Payer: Medicare HMO | Attending: Emergency Medicine | Admitting: Emergency Medicine

## 2016-09-06 ENCOUNTER — Emergency Department (HOSPITAL_COMMUNITY): Payer: Medicare HMO

## 2016-09-06 DIAGNOSIS — Z7982 Long term (current) use of aspirin: Secondary | ICD-10-CM | POA: Insufficient documentation

## 2016-09-06 DIAGNOSIS — R531 Weakness: Secondary | ICD-10-CM | POA: Insufficient documentation

## 2016-09-06 DIAGNOSIS — I509 Heart failure, unspecified: Secondary | ICD-10-CM | POA: Diagnosis not present

## 2016-09-06 DIAGNOSIS — N390 Urinary tract infection, site not specified: Secondary | ICD-10-CM | POA: Diagnosis not present

## 2016-09-06 DIAGNOSIS — Z79899 Other long term (current) drug therapy: Secondary | ICD-10-CM | POA: Insufficient documentation

## 2016-09-06 DIAGNOSIS — Z7984 Long term (current) use of oral hypoglycemic drugs: Secondary | ICD-10-CM | POA: Diagnosis not present

## 2016-09-06 DIAGNOSIS — R9431 Abnormal electrocardiogram [ECG] [EKG]: Secondary | ICD-10-CM | POA: Diagnosis not present

## 2016-09-06 DIAGNOSIS — J45909 Unspecified asthma, uncomplicated: Secondary | ICD-10-CM | POA: Insufficient documentation

## 2016-09-06 DIAGNOSIS — I2510 Atherosclerotic heart disease of native coronary artery without angina pectoris: Secondary | ICD-10-CM | POA: Insufficient documentation

## 2016-09-06 DIAGNOSIS — E119 Type 2 diabetes mellitus without complications: Secondary | ICD-10-CM | POA: Diagnosis not present

## 2016-09-06 MED ORDER — SODIUM CHLORIDE 0.9 % IV BOLUS (SEPSIS)
1000.0000 mL | Freq: Once | INTRAVENOUS | Status: AC
  Administered 2016-09-07: 1000 mL via INTRAVENOUS

## 2016-09-06 NOTE — ED Notes (Signed)
ED Provider at bedside. 

## 2016-09-06 NOTE — ED Provider Notes (Signed)
[facility]-EMERGENCY DEPT Provider Note   CSN: [id number] Arrival date & time: 09/06/16  2112  By signing my name below, I, Vanessa [name], attest that this documentation has been prepared under the direction and in the presence of Vanessa [name], Vanessa Waln, MD. Electronically Signed: Karren CobbleNy'Kea [name], ED Scribe. 09/07/16. 1:13 AM.  History   Chief Complaint Chief Complaint  Patient presents with  . Weakness   The history is provided by the patient and a relative. No language interpreter was used.    HPI Comments: Vanessa [name] is a 66 y.o. female with a PMHx of CAD, DM II, and CHF, who presents to the Emergency Department complaining of persistent, generalized weakness that began three days ago but worsened today. She notes associated intermittent "chills/hot spells", unsteady gait, and a decreased appetitive. Pt reports today that she was unable to participate in her water aerobic class like she normally does. She notes she just feels "completely drained". Per pt's daughter, pt has a hx of pancreatic insufficieny and notes the last time she had and issue the symptoms where similar to how she feels now. No treatment tried. No alleviating or aggravating factors noted at this time. Denies headache, chest pain, shortness of breath, back pain, abdominal pain, nausea, vomiting, diarrhea, or cough.   Past Medical History:  Diagnosis Date  . Arthritis   . Arthropathy, unspecified, site unspecified   . Congestive cardiac failure (HCC)   . Coronary artery disease   . 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 Other and unspecified noninfectious gastroenteritis and colitis(558.9)   . Other specified disease of pancreas   . Personal history of urinary calculi   . Stroke [facility](HCC)    Nov 2012  . Type II or unspecified type diabetes mellitus without mention of complication, not stated as uncontrolled   . Unspecified asthma(493.90)   . Unspecified essential hypertension    Patient Active  Problem List   Diagnosis Date Noted  . Nausea alone 12/27/2010  . DM 07/07/2007  . ASTHMA 07/07/2007  . PANCREATIC INSUFFICIENCY 07/07/2007  . ARTHRITIS 07/07/2007  . 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 KNEE ARTHROSCOPY    . left arm fracture    . TUBAL LIGATION     OB History    No data available      Home Medications    Prior to Admission medications   Medication Sig Start Date End Date Taking? Authorizing Provider  aspirin 81 MG tablet Take 81 mg by mouth daily.     [provider]  Bepotastine Besilate (BEPREVE) 1.5 % SOLN Place 2 drops into both eyes 2 (two) times daily as needed. Dry eyes     [provider]  carvedilol (COREG) 12.5 MG tablet Take 12.5 mg by mouth 2 (two) times daily with a meal.    [provider]  Cholecalciferol (VITAMIN D3) 50000 units CAPS Take 50,000 Units by mouth once a week. mondays    [provider]  furosemide (LASIX) 20 MG tablet Take 20 mg by mouth daily.    [provider]  hydrOXYzine (VISTARIL) 25 MG capsule Take 25 mg by mouth 3 (three) times daily.  08/30/15   [provider]  lipase/protease/amylase (CREON) 36000 UNITS CPEP capsule Take 1-3 capsules by mouth daily. Take 3 capsules with every meal and 1 capsule with every snack 09/27/14   [provider]  lisinopril (PRINIVIL,ZESTRIL) 40  MG tablet Take 40 mg by mouth daily.    [provider]  metFORMIN (GLUCOPHAGE) 1000 MG tablet Take 1,000 mg by mouth 2 (two) times daily with a meal.     [provider]  naproxen (NAPROSYN) 500 MG tablet Take 1 tablet (500 mg total) by mouth 2 (two) times daily. 10/02/15   Dowless, Lelon Mast Tripp, PA-C  simvastatin (ZOCOR) 20 MG tablet Take 1 tablet (20 mg total) by mouth at bedtime. 02/22/11 10/02/15  Gwenyth Bender, NP  sitaGLIPtin (JANUVIA) 100 MG tablet Take 100 mg by mouth daily. 08/30/15 08/29/16  [provider]    terbinafine (LAMISIL) 250 MG tablet Take 250 mg by mouth daily.    [provider]  zolpidem (AMBIEN) 5 MG tablet Take 5 mg by mouth at bedtime.     [provider]   Family History Family History  Problem Relation Age of Onset  . Diabetes Father        multiple family members  . Heart disease Father        brother, paternal aunt  . Lung cancer Father   . Leukemia Mother   . Colon cancer Neg Hx    Social History Social History  Substance Use Topics  . Smoking status: Never Smoker  . Smokeless tobacco: Never Used  . Alcohol use No   Allergies   Phenytoin  Review of Systems Review of Systems  Constitutional: Positive for appetite change and chills.  Respiratory: Negative for cough and shortness of breath.   Cardiovascular: Negative for chest pain.  Gastrointestinal: Negative for abdominal pain, diarrhea, nausea and vomiting.  Musculoskeletal: Negative for back pain.  Neurological: Positive for weakness. Negative for dizziness, light-headedness and headaches.       Unsteady gait.   All other systems reviewed and are negative.  Physical Exam Updated Vital Signs BP 114/78 (BP Location: Right Arm)   Pulse 66   Temp 97.4 F (36.3 C) (Oral)   Resp 20   Wt 205 lb (93 kg)   SpO2 99%   BMI 38.11 kg/m   Physical Exam  Constitutional: She is oriented to person, place, and time. She appears well-developed and well-nourished.  HENT:  Head: Normocephalic and atraumatic.  Eyes: EOM are normal. Pupils are equal, round, and reactive to light.  Neck: Normal range of motion. Neck supple. No JVD present.  Cardiovascular: Normal rate, regular rhythm and normal heart sounds.   No murmur heard. Pulmonary/Chest: Effort normal and breath sounds normal. She has no wheezes. She has no rales. She exhibits no tenderness.  Abdominal: Soft. Bowel sounds are normal. She exhibits no distension and no mass. There is no tenderness.  Musculoskeletal: Normal range of motion. She  exhibits no edema.  Lymphadenopathy:    She has no cervical adenopathy.  Neurological: She is alert and oriented to person, place, and time. No cranial nerve deficit. She exhibits normal muscle tone. Coordination normal.  Normal finger to nose.   Skin: Skin is warm and dry. No rash noted.  Psychiatric: She has a normal mood and affect. Her behavior is normal. Judgment and thought content normal.  Nursing note and vitals reviewed.  ED Treatments / Results  DIAGNOSTIC STUDIES: Oxygen Saturation is 99% on RA, normal by my interpretation.   COORDINATION OF CARE: 11:46 PM-Discussed next steps with pt. Pt verbalized understanding and is agreeable with the plan.   Labs (all labs ordered are listed, but only abnormal results are displayed) Labs Reviewed  URINALYSIS, ROUTINE W  REFLEX MICROSCOPIC - Abnormal; Notable for the following:       Result Value   Leukocytes, UA LARGE (*)    Squamous Epithelial / LPF 0-5 (*)    All other components within normal limits  URINE CULTURE  CBC WITH DIFFERENTIAL/PLATELET  COMPREHENSIVE METABOLIC PANEL  CK  I-STAT TROPOININ, ED    EKG  EKG Interpretation  Date/Time:  Friday September 07 2016 02:42:43 EDT Ventricular Rate:  67 PR Interval:    QRS Duration: 101 QT Interval:  436 QTC Calculation: 461 R Axis:   34 Text Interpretation:  Sinus rhythm Abnormal R-wave progression, early transition Borderline T abnormalities, anterior leads When compared with ECG of 07/26/2012, No significant change was found Confirmed by Vanessa Booze ([id number]) on 09/07/2016 2:48:31 AM Also confirmed by Vanessa Booze ([id number]), editor Elita Quick ([id number])  on 09/07/2016 7:26:58 AM      Radiology Dg Chest 2 View  Result Date: 09/07/2016 CLINICAL DATA:  Acute onset of generalized weakness and lethargy. Initial encounter. EXAM: CHEST  2 VIEW COMPARISON:  Chest radiograph performed 07/26/2012 FINDINGS: The lungs are well-aerated and clear. There is no evidence of focal  opacification, pleural effusion or pneumothorax. The heart is normal in size; the mediastinal contour is within normal limits. No acute osseous abnormalities are seen. IMPRESSION: No acute cardiopulmonary process seen. Electronically Signed   By: Roanna Raider M.D.   On: 09/07/2016 00:06    Procedures Procedures (including critical care time)  Medications Ordered in ED Medications - No data to display   Initial Impression / Assessment and Plan / ED Course  I have reviewed the triage vital signs and the nursing notes.  Pertinent labs & imaging results that were available during my care of the patient were reviewed by me and considered in my medical decision making (see chart for details).  Generalized weakness of uncertain cause. Consider occult infection, consider electrolyte disturbance. Screening labs and intravenous fluids are ordered. Urinalysis is come back with a 2 numerous to count WBCs but negative nitrite and no bacteria. I am concerned that this still represents a urinary tract infection. Specimen sent for culture and she is given a dose of ceftriaxone. Chest x-ray shows no evidence of pneumonia. Laboratory workup is pending at this time. Apparently, there was a delay in having labs drawn because of poor veins. Case is signed out to Dr. Judd Lien.  Final Clinical Impressions(s) / ED Diagnoses   Final diagnoses:  Weakness  Urinary tract infection without hematuria, site unspecified    New Prescriptions New Prescriptions   No medications on file   I personally performed the services described in this documentation, which was scribed in my presence. The recorded information has been reviewed and is accurate.       Vanessa Booze, MD 09/07/16 [phone]

## 2016-09-06 NOTE — ED Triage Notes (Signed)
Pt complains of being very weak and lethargic for about three days, denies any pain, vomiting or diarrhea Pt states she's usually very active and was unable to get through her exercise class today

## 2016-09-06 NOTE — ED Notes (Signed)
Patient transported to X-ray 

## 2016-09-07 DIAGNOSIS — R69 Illness, unspecified: Secondary | ICD-10-CM | POA: Diagnosis not present

## 2016-09-07 DIAGNOSIS — I2510 Atherosclerotic heart disease of native coronary artery without angina pectoris: Secondary | ICD-10-CM | POA: Diagnosis not present

## 2016-09-07 DIAGNOSIS — Z79899 Other long term (current) drug therapy: Secondary | ICD-10-CM | POA: Diagnosis not present

## 2016-09-07 DIAGNOSIS — E1165 Type 2 diabetes mellitus with hyperglycemia: Secondary | ICD-10-CM | POA: Diagnosis not present

## 2016-09-07 DIAGNOSIS — Z8673 Personal history of transient ischemic attack (TIA), and cerebral infarction without residual deficits: Secondary | ICD-10-CM | POA: Diagnosis not present

## 2016-09-07 DIAGNOSIS — I10 Essential (primary) hypertension: Secondary | ICD-10-CM | POA: Diagnosis not present

## 2016-09-07 DIAGNOSIS — N3000 Acute cystitis without hematuria: Secondary | ICD-10-CM | POA: Diagnosis not present

## 2016-09-07 DIAGNOSIS — Z7984 Long term (current) use of oral hypoglycemic drugs: Secondary | ICD-10-CM | POA: Diagnosis not present

## 2016-09-07 DIAGNOSIS — I509 Heart failure, unspecified: Secondary | ICD-10-CM | POA: Diagnosis not present

## 2016-09-07 DIAGNOSIS — E119 Type 2 diabetes mellitus without complications: Secondary | ICD-10-CM | POA: Diagnosis not present

## 2016-09-07 DIAGNOSIS — I639 Cerebral infarction, unspecified: Secondary | ICD-10-CM | POA: Diagnosis not present

## 2016-09-07 DIAGNOSIS — N390 Urinary tract infection, site not specified: Secondary | ICD-10-CM | POA: Diagnosis not present

## 2016-09-07 DIAGNOSIS — R531 Weakness: Secondary | ICD-10-CM | POA: Diagnosis not present

## 2016-09-07 DIAGNOSIS — Z1231 Encounter for screening mammogram for malignant neoplasm of breast: Secondary | ICD-10-CM | POA: Diagnosis not present

## 2016-09-07 DIAGNOSIS — Z7982 Long term (current) use of aspirin: Secondary | ICD-10-CM | POA: Diagnosis not present

## 2016-09-07 DIAGNOSIS — J45909 Unspecified asthma, uncomplicated: Secondary | ICD-10-CM | POA: Diagnosis not present

## 2016-09-07 LAB — URINALYSIS, ROUTINE W REFLEX MICROSCOPIC
BILIRUBIN URINE: NEGATIVE
Bacteria, UA: NONE SEEN
Glucose, UA: NEGATIVE mg/dL
HGB URINE DIPSTICK: NEGATIVE
Ketones, ur: NEGATIVE mg/dL
NITRITE: NEGATIVE
Protein, ur: NEGATIVE mg/dL
SPECIFIC GRAVITY, URINE: 1.023 (ref 1.005–1.030)
pH: 6 (ref 5.0–8.0)

## 2016-09-07 LAB — CBC WITH DIFFERENTIAL/PLATELET
BASOS PCT: 0 %
Basophils Absolute: 0 10*3/uL (ref 0.0–0.1)
EOS ABS: 0.5 10*3/uL (ref 0.0–0.7)
EOS PCT: 7 %
HCT: 35.9 % — ABNORMAL LOW (ref 36.0–46.0)
HEMOGLOBIN: 11.7 g/dL — AB (ref 12.0–15.0)
Lymphocytes Relative: 24 %
Lymphs Abs: 1.6 10*3/uL (ref 0.7–4.0)
MCH: 27.5 pg (ref 26.0–34.0)
MCHC: 32.6 g/dL (ref 30.0–36.0)
MCV: 84.3 fL (ref 78.0–100.0)
Monocytes Absolute: 0.5 10*3/uL (ref 0.1–1.0)
Monocytes Relative: 7 %
NEUTROS PCT: 62 %
Neutro Abs: 4.3 10*3/uL (ref 1.7–7.7)
PLATELETS: 474 10*3/uL — AB (ref 150–400)
RBC: 4.26 MIL/uL (ref 3.87–5.11)
RDW: 16.7 % — ABNORMAL HIGH (ref 11.5–15.5)
WBC: 6.9 10*3/uL (ref 4.0–10.5)

## 2016-09-07 LAB — COMPREHENSIVE METABOLIC PANEL
ALBUMIN: 3.8 g/dL (ref 3.5–5.0)
ALK PHOS: 69 U/L (ref 38–126)
ALT: 14 U/L (ref 14–54)
ANION GAP: 8 (ref 5–15)
AST: 14 U/L — ABNORMAL LOW (ref 15–41)
BUN: 13 mg/dL (ref 6–20)
CALCIUM: 9.2 mg/dL (ref 8.9–10.3)
CHLORIDE: 103 mmol/L (ref 101–111)
CO2: 28 mmol/L (ref 22–32)
CREATININE: 0.67 mg/dL (ref 0.44–1.00)
GFR calc non Af Amer: >60 mL/min (ref >60)
GLUCOSE: 116 mg/dL — AB (ref 65–99)
Potassium: 3.9 mmol/L (ref 3.5–5.1)
SODIUM: 139 mmol/L (ref 135–145)
Total Bilirubin: 0.7 mg/dL (ref 0.3–1.2)
Total Protein: 7.5 g/dL (ref 6.5–8.1)

## 2016-09-07 LAB — I-STAT TROPONIN, ED: TROPONIN I, POC: 0 ng/mL (ref 0.00–0.08)

## 2016-09-07 LAB — CK: Total CK: 89 U/L (ref 38–234)

## 2016-09-07 MED ORDER — DEXTROSE 5 % IV SOLN
1.0000 g | Freq: Once | INTRAVENOUS | Status: AC
  Administered 2016-09-07: 1 g via INTRAVENOUS
  Filled 2016-09-07: qty 10

## 2016-09-07 MED ORDER — CEPHALEXIN 500 MG PO CAPS: 500.0000 mg | ORAL_CAPSULE | Freq: Four times a day (QID) | ORAL | 0 refills | Status: DC

## 2016-09-07 NOTE — ED Notes (Signed)
Attempted to pull blood from IV, unsuccessful. Will repeat attempt after fluid bolus goes in.

## 2016-09-07 NOTE — ED Notes (Signed)
Attempted to draw blood from IV again. Unsuccessful. Main lab called to draw blood.

## 2016-09-07 NOTE — Discharge Instructions (Signed)
Keflex as prescribed.  Drink plenty of fluids and get plenty of rest.  Follow-up with your primary Dr. in the next week for recheck, and return to the ER if symptoms significantly worsen or change.

## 2016-09-07 NOTE — ED Provider Notes (Signed)
Care assumed from Dr. Preston FleetingGlick at shift change. Patient presents here with complaints of weakness. She has been here nearly 11 hours at the time she was signed out to me awaiting results of laboratory studies. Her laboratory studies reveal no significant abnormalities. There is no leukocytosis, anemia, or electrolyte shift that would suggest dehydration. She has received a liter of normal saline here in the ER. The only significant finding in her workup is the suggestion of a urinary tract infection. She has too numerous to count white blood cells. This was treated with Rocephin.  I discussed the results of these tests with the patient and family at bedside and informed them that she will be discharged with oral antibiotics. The patient then informed me that she felt "too weak" to be discharged and was demanding admission. I again informed them that I saw no indication for this and recommended trying outpatient antibiotics. The patient and family again insisted upon her not going home. I then discussed the case with Dr. Malachi Bondsshort from the hospitalist service who agrees with my assessment that there is no indication for admission. I then informed the family and patient of this discussion. She will be discharged with Keflex and follow-up with her primary doctor.  While in the emergency department, the patient was ambulatory to the bathroom on her own requiring no assistance.   Geoffery Lyonselo, Nils Thor, MD 09/07/16 [id number]

## 2016-09-07 NOTE — ED Notes (Addendum)
Patient returned from X-ray 

## 2016-09-07 NOTE — ED Notes (Addendum)
Attempted to place IV and draw blood. Unsuccessful x 2

## 2016-09-08 LAB — URINE CULTURE: Culture: <10000 — AB

## 2016-12-11 DIAGNOSIS — Z1231 Encounter for screening mammogram for malignant neoplasm of breast: Secondary | ICD-10-CM | POA: Diagnosis not present

## 2016-12-11 DIAGNOSIS — I10 Essential (primary) hypertension: Secondary | ICD-10-CM | POA: Diagnosis not present

## 2016-12-11 DIAGNOSIS — E1165 Type 2 diabetes mellitus with hyperglycemia: Secondary | ICD-10-CM | POA: Diagnosis not present

## 2016-12-11 DIAGNOSIS — E785 Hyperlipidemia, unspecified: Secondary | ICD-10-CM | POA: Diagnosis not present

## 2016-12-11 DIAGNOSIS — E559 Vitamin D deficiency, unspecified: Secondary | ICD-10-CM | POA: Diagnosis not present

## 2016-12-11 DIAGNOSIS — Z23 Encounter for immunization: Secondary | ICD-10-CM | POA: Diagnosis not present

## 2016-12-11 DIAGNOSIS — R5383 Other fatigue: Secondary | ICD-10-CM | POA: Diagnosis not present

## 2016-12-11 DIAGNOSIS — E119 Type 2 diabetes mellitus without complications: Secondary | ICD-10-CM | POA: Diagnosis not present

## 2016-12-11 DIAGNOSIS — R69 Illness, unspecified: Secondary | ICD-10-CM | POA: Diagnosis not present

## 2017-01-22 DIAGNOSIS — H401113 Primary open-angle glaucoma, right eye, severe stage: Secondary | ICD-10-CM | POA: Diagnosis not present

## 2017-01-22 DIAGNOSIS — H401122 Primary open-angle glaucoma, left eye, moderate stage: Secondary | ICD-10-CM | POA: Diagnosis not present

## 2017-01-22 DIAGNOSIS — H2513 Age-related nuclear cataract, bilateral: Secondary | ICD-10-CM | POA: Diagnosis not present

## 2017-02-05 DIAGNOSIS — Z1231 Encounter for screening mammogram for malignant neoplasm of breast: Secondary | ICD-10-CM | POA: Diagnosis not present

## 2017-03-12 DIAGNOSIS — E1165 Type 2 diabetes mellitus with hyperglycemia: Secondary | ICD-10-CM | POA: Diagnosis not present

## 2017-03-12 DIAGNOSIS — Z23 Encounter for immunization: Secondary | ICD-10-CM | POA: Diagnosis not present

## 2017-03-12 DIAGNOSIS — I10 Essential (primary) hypertension: Secondary | ICD-10-CM | POA: Diagnosis not present

## 2017-03-12 DIAGNOSIS — R5383 Other fatigue: Secondary | ICD-10-CM | POA: Diagnosis not present

## 2017-03-12 DIAGNOSIS — Z1211 Encounter for screening for malignant neoplasm of colon: Secondary | ICD-10-CM | POA: Diagnosis not present

## 2017-04-25 DIAGNOSIS — H2513 Age-related nuclear cataract, bilateral: Secondary | ICD-10-CM | POA: Diagnosis not present

## 2017-04-25 DIAGNOSIS — H401122 Primary open-angle glaucoma, left eye, moderate stage: Secondary | ICD-10-CM | POA: Diagnosis not present

## 2017-04-25 DIAGNOSIS — H401113 Primary open-angle glaucoma, right eye, severe stage: Secondary | ICD-10-CM | POA: Diagnosis not present

## 2017-08-13 IMAGING — CR DG CHEST 2V
2 series · 2 of 2 positions shown · non-contrast
Comparison: Chest radiograph performed 07/26/2012

CLINICAL DATA: Acute onset of generalized weakness and lethargy.
Initial encounter.

EXAM:
CHEST  2 VIEW

[w chest pa]
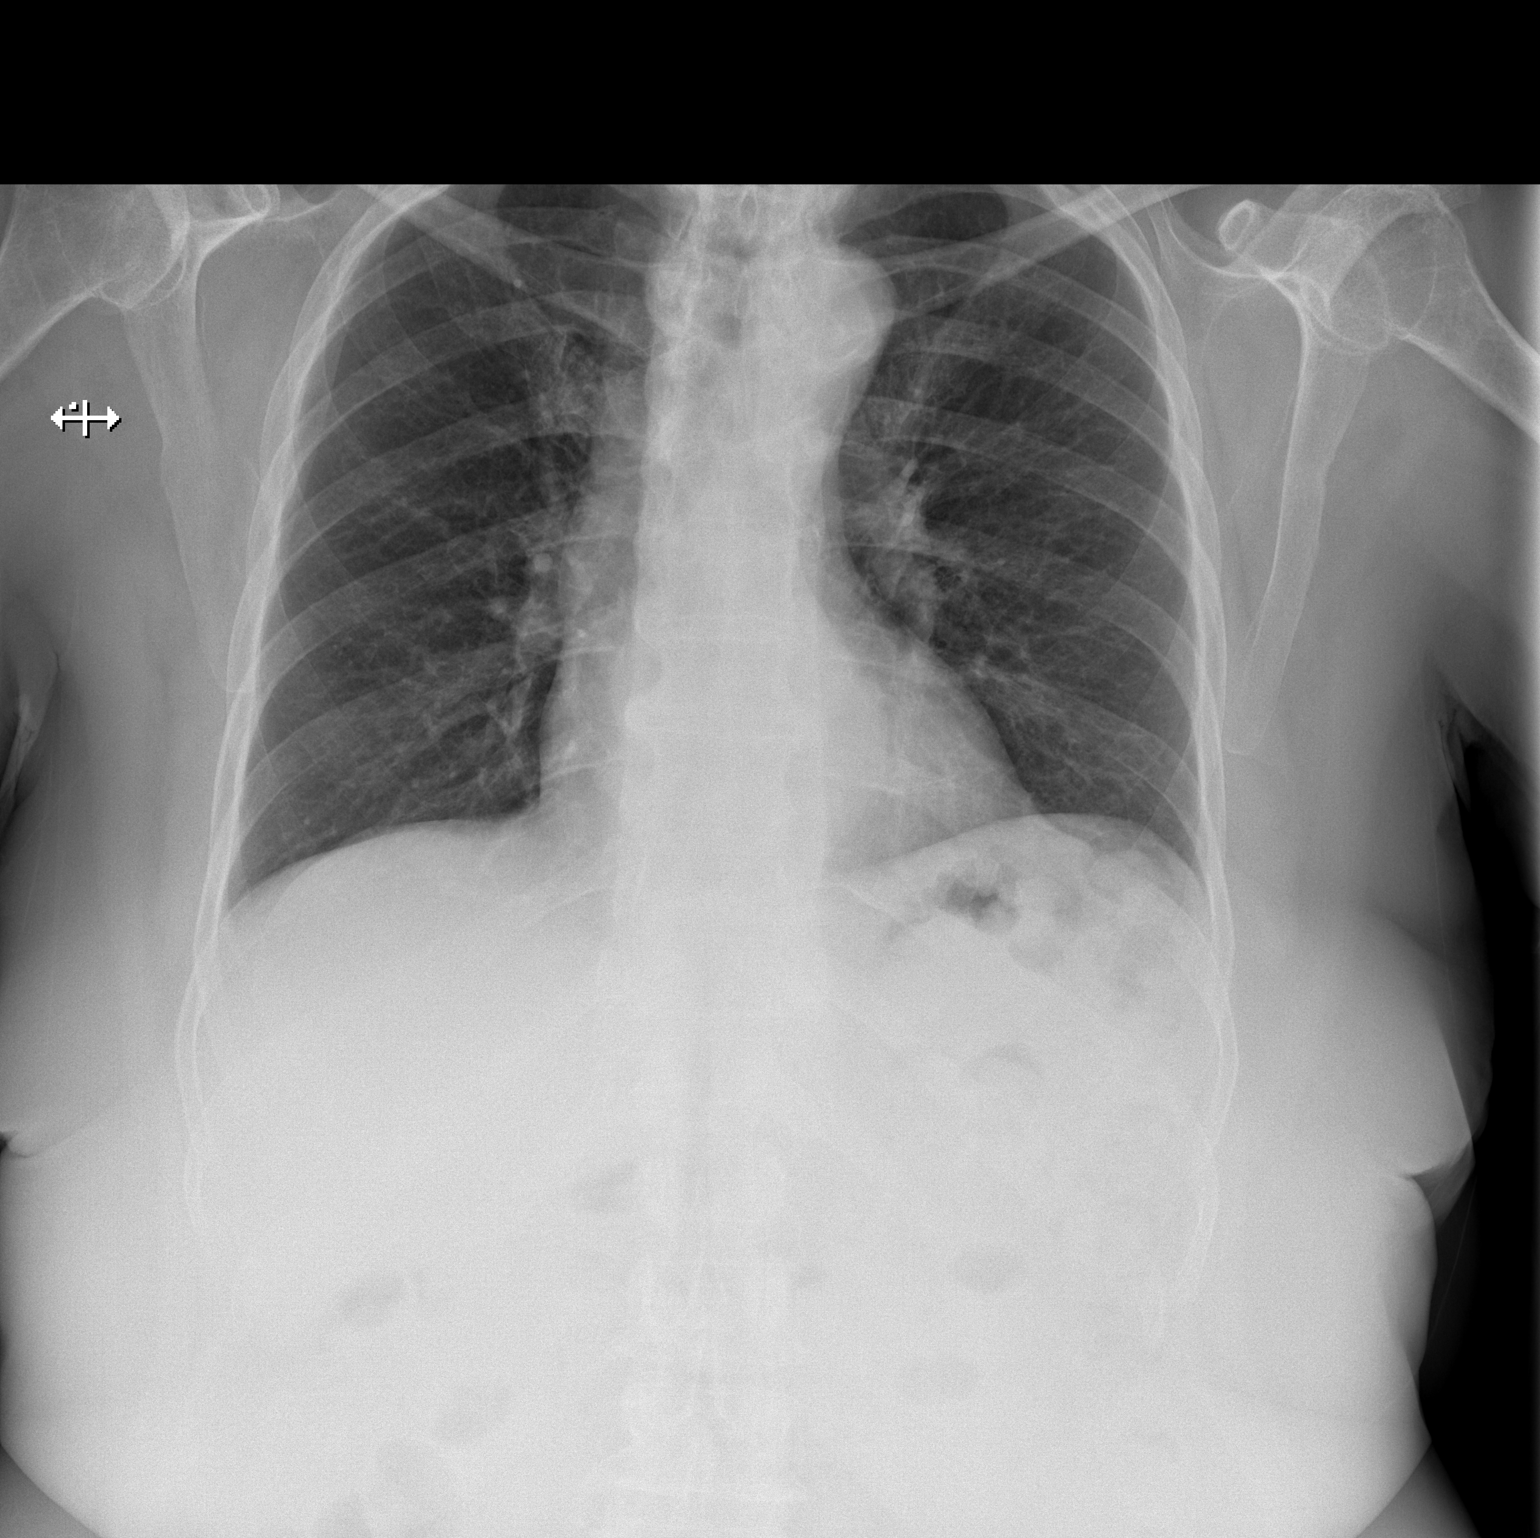

[w chest lat]
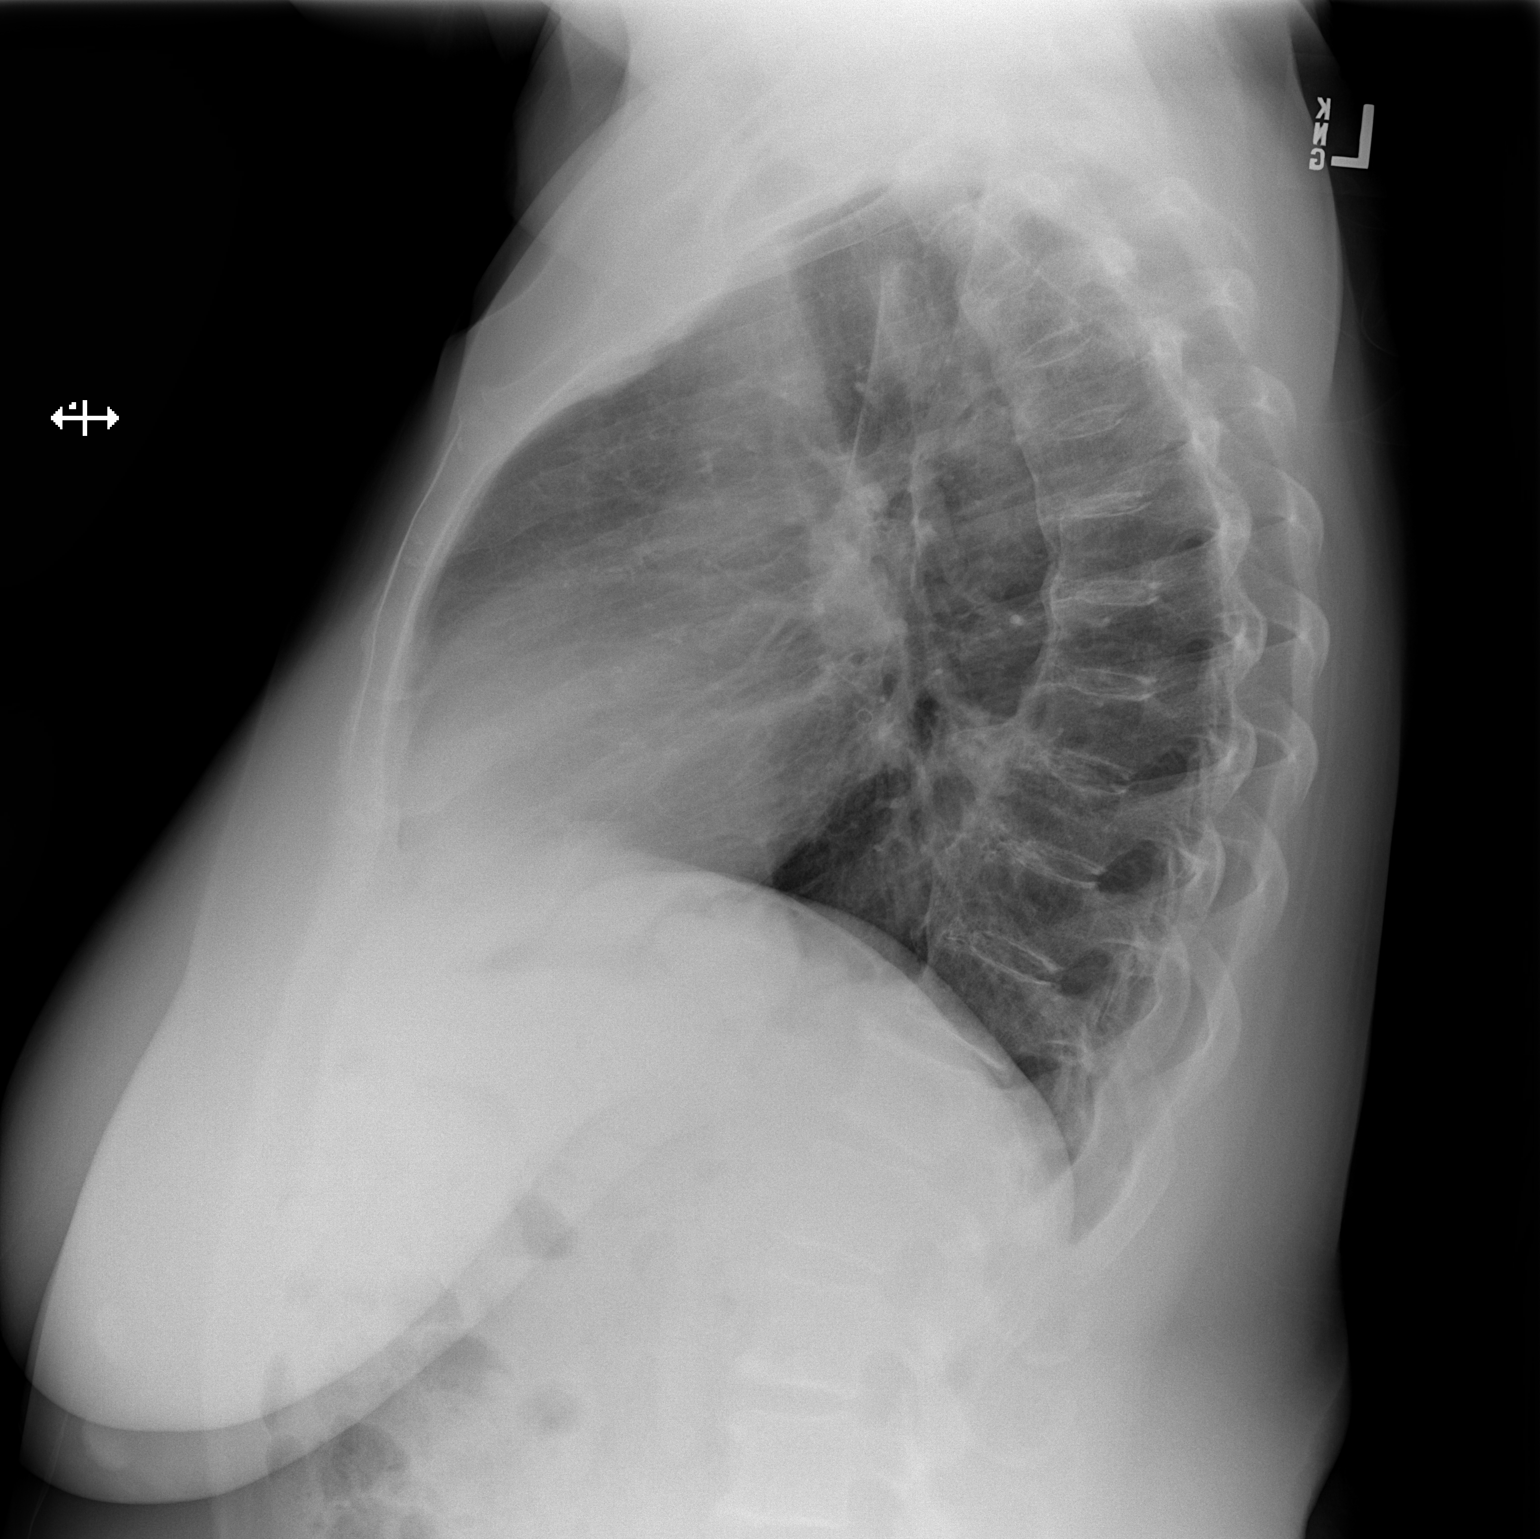

[2 of 2 positions shown; findings below may reference images not displayed]

FINDINGS: The lungs are well-aerated and clear. There is no evidence of focal
opacification, pleural effusion or pneumothorax.

The heart is normal in size; the mediastinal contour is within
normal limits. No acute osseous abnormalities are seen.
IMPRESSION: No acute cardiopulmonary process seen.

## 2017-08-31 DIAGNOSIS — L03116 Cellulitis of left lower limb: Secondary | ICD-10-CM | POA: Diagnosis not present

## 2017-08-31 DIAGNOSIS — Y33XXXA Other specified events, undetermined intent, initial encounter: Secondary | ICD-10-CM | POA: Diagnosis not present

## 2017-08-31 DIAGNOSIS — M7989 Other specified soft tissue disorders: Secondary | ICD-10-CM | POA: Diagnosis not present

## 2017-08-31 DIAGNOSIS — S8012XA Contusion of left lower leg, initial encounter: Secondary | ICD-10-CM | POA: Diagnosis not present

## 2017-11-13 DIAGNOSIS — I10 Essential (primary) hypertension: Secondary | ICD-10-CM | POA: Diagnosis not present

## 2017-11-13 DIAGNOSIS — R69 Illness, unspecified: Secondary | ICD-10-CM | POA: Diagnosis not present

## 2017-11-13 DIAGNOSIS — R238 Other skin changes: Secondary | ICD-10-CM | POA: Diagnosis not present

## 2017-11-13 DIAGNOSIS — R252 Cramp and spasm: Secondary | ICD-10-CM | POA: Diagnosis not present

## 2017-11-13 DIAGNOSIS — E1165 Type 2 diabetes mellitus with hyperglycemia: Secondary | ICD-10-CM | POA: Diagnosis not present

## 2017-11-26 DIAGNOSIS — E875 Hyperkalemia: Secondary | ICD-10-CM | POA: Diagnosis not present

## 2017-12-26 DIAGNOSIS — M1990 Unspecified osteoarthritis, unspecified site: Secondary | ICD-10-CM | POA: Diagnosis not present

## 2017-12-26 DIAGNOSIS — G8929 Other chronic pain: Secondary | ICD-10-CM | POA: Diagnosis not present

## 2017-12-26 DIAGNOSIS — K8689 Other specified diseases of pancreas: Secondary | ICD-10-CM | POA: Diagnosis not present

## 2017-12-26 DIAGNOSIS — F419 Anxiety disorder, unspecified: Secondary | ICD-10-CM | POA: Diagnosis not present

## 2017-12-26 DIAGNOSIS — E1165 Type 2 diabetes mellitus with hyperglycemia: Secondary | ICD-10-CM | POA: Diagnosis not present

## 2017-12-26 DIAGNOSIS — G4700 Insomnia, unspecified: Secondary | ICD-10-CM | POA: Diagnosis not present

## 2017-12-26 DIAGNOSIS — R69 Illness, unspecified: Secondary | ICD-10-CM | POA: Diagnosis not present

## 2017-12-26 DIAGNOSIS — H409 Unspecified glaucoma: Secondary | ICD-10-CM | POA: Diagnosis not present

## 2017-12-26 DIAGNOSIS — I10 Essential (primary) hypertension: Secondary | ICD-10-CM | POA: Diagnosis not present

## 2017-12-27 DIAGNOSIS — R1013 Epigastric pain: Secondary | ICD-10-CM | POA: Diagnosis not present

## 2017-12-27 DIAGNOSIS — K3189 Other diseases of stomach and duodenum: Secondary | ICD-10-CM | POA: Diagnosis not present

## 2017-12-27 DIAGNOSIS — N289 Disorder of kidney and ureter, unspecified: Secondary | ICD-10-CM | POA: Diagnosis not present

## 2017-12-27 DIAGNOSIS — K869 Disease of pancreas, unspecified: Secondary | ICD-10-CM | POA: Diagnosis not present

## 2017-12-27 DIAGNOSIS — R1084 Generalized abdominal pain: Secondary | ICD-10-CM | POA: Diagnosis not present

## 2017-12-27 DIAGNOSIS — R110 Nausea: Secondary | ICD-10-CM | POA: Diagnosis not present

## 2017-12-27 DIAGNOSIS — R51 Headache: Secondary | ICD-10-CM | POA: Diagnosis not present

## 2018-01-15 DIAGNOSIS — M2042 Other hammer toe(s) (acquired), left foot: Secondary | ICD-10-CM | POA: Diagnosis not present

## 2018-01-15 DIAGNOSIS — M79671 Pain in right foot: Secondary | ICD-10-CM | POA: Diagnosis not present

## 2018-01-15 DIAGNOSIS — M79672 Pain in left foot: Secondary | ICD-10-CM | POA: Diagnosis not present

## 2018-01-15 DIAGNOSIS — M25572 Pain in left ankle and joints of left foot: Secondary | ICD-10-CM | POA: Diagnosis not present

## 2018-01-15 DIAGNOSIS — M25571 Pain in right ankle and joints of right foot: Secondary | ICD-10-CM | POA: Diagnosis not present

## 2018-01-15 DIAGNOSIS — M2041 Other hammer toe(s) (acquired), right foot: Secondary | ICD-10-CM | POA: Diagnosis not present

## 2018-02-03 DIAGNOSIS — M722 Plantar fascial fibromatosis: Secondary | ICD-10-CM | POA: Diagnosis not present

## 2018-02-11 DIAGNOSIS — E785 Hyperlipidemia, unspecified: Secondary | ICD-10-CM | POA: Diagnosis not present

## 2018-02-11 DIAGNOSIS — Z23 Encounter for immunization: Secondary | ICD-10-CM | POA: Diagnosis not present

## 2018-02-11 DIAGNOSIS — K2950 Unspecified chronic gastritis without bleeding: Secondary | ICD-10-CM | POA: Diagnosis not present

## 2018-02-11 DIAGNOSIS — R69 Illness, unspecified: Secondary | ICD-10-CM | POA: Diagnosis not present

## 2018-02-11 DIAGNOSIS — E1165 Type 2 diabetes mellitus with hyperglycemia: Secondary | ICD-10-CM | POA: Diagnosis not present

## 2018-02-11 DIAGNOSIS — M19079 Primary osteoarthritis, unspecified ankle and foot: Secondary | ICD-10-CM | POA: Diagnosis not present

## 2018-02-12 DIAGNOSIS — K2950 Unspecified chronic gastritis without bleeding: Secondary | ICD-10-CM | POA: Diagnosis not present

## 2018-02-12 DIAGNOSIS — Z6841 Body Mass Index (BMI) 40.0 and over, adult: Secondary | ICD-10-CM | POA: Diagnosis not present

## 2018-02-12 DIAGNOSIS — E1165 Type 2 diabetes mellitus with hyperglycemia: Secondary | ICD-10-CM | POA: Diagnosis not present

## 2018-02-12 DIAGNOSIS — E785 Hyperlipidemia, unspecified: Secondary | ICD-10-CM | POA: Diagnosis not present

## 2018-02-12 DIAGNOSIS — M19079 Primary osteoarthritis, unspecified ankle and foot: Secondary | ICD-10-CM | POA: Diagnosis not present

## 2018-02-12 DIAGNOSIS — R69 Illness, unspecified: Secondary | ICD-10-CM | POA: Diagnosis not present

## 2018-02-12 DIAGNOSIS — R1013 Epigastric pain: Secondary | ICD-10-CM | POA: Diagnosis not present

## 2018-02-12 DIAGNOSIS — Z23 Encounter for immunization: Secondary | ICD-10-CM | POA: Diagnosis not present

## 2018-02-18 DIAGNOSIS — H5203 Hypermetropia, bilateral: Secondary | ICD-10-CM | POA: Diagnosis not present

## 2018-02-20 DIAGNOSIS — R69 Illness, unspecified: Secondary | ICD-10-CM | POA: Diagnosis not present

## 2018-02-21 DIAGNOSIS — M25571 Pain in right ankle and joints of right foot: Secondary | ICD-10-CM | POA: Diagnosis not present

## 2018-02-21 DIAGNOSIS — M722 Plantar fascial fibromatosis: Secondary | ICD-10-CM | POA: Diagnosis not present

## 2018-02-21 DIAGNOSIS — M25572 Pain in left ankle and joints of left foot: Secondary | ICD-10-CM | POA: Diagnosis not present

## 2018-02-26 DIAGNOSIS — H5203 Hypermetropia, bilateral: Secondary | ICD-10-CM | POA: Diagnosis not present

## 2018-02-26 DIAGNOSIS — H52209 Unspecified astigmatism, unspecified eye: Secondary | ICD-10-CM | POA: Diagnosis not present

## 2018-02-26 DIAGNOSIS — H524 Presbyopia: Secondary | ICD-10-CM | POA: Diagnosis not present

## 2018-03-19 DIAGNOSIS — K5730 Diverticulosis of large intestine without perforation or abscess without bleeding: Secondary | ICD-10-CM | POA: Diagnosis not present

## 2018-03-19 DIAGNOSIS — Z1211 Encounter for screening for malignant neoplasm of colon: Secondary | ICD-10-CM | POA: Diagnosis not present

## 2018-03-19 DIAGNOSIS — K640 First degree hemorrhoids: Secondary | ICD-10-CM | POA: Diagnosis not present

## 2018-04-04 DIAGNOSIS — H401113 Primary open-angle glaucoma, right eye, severe stage: Secondary | ICD-10-CM | POA: Diagnosis not present

## 2018-04-04 DIAGNOSIS — H401122 Primary open-angle glaucoma, left eye, moderate stage: Secondary | ICD-10-CM | POA: Diagnosis not present

## 2018-04-04 DIAGNOSIS — H2513 Age-related nuclear cataract, bilateral: Secondary | ICD-10-CM | POA: Diagnosis not present

## 2018-11-05 ENCOUNTER — Other Ambulatory Visit: Payer: Self-pay | Admitting: Internal Medicine

## 2020-05-23 ENCOUNTER — Other Ambulatory Visit: Payer: Self-pay | Admitting: Radiology

## 2022-11-06 ENCOUNTER — Telehealth (HOSPITAL_COMMUNITY): Payer: Self-pay | Admitting: *Deleted

## 2022-11-08 ENCOUNTER — Encounter (HOSPITAL_COMMUNITY)
Admission: RE | Admit: 2022-11-08 | Discharge: 2022-11-08 | Disposition: A | Discharge status: Home / Self Care | Payer: Medicare HMO | Source: Ambulatory Visit | Attending: Internal Medicine | Admitting: Internal Medicine

## 2022-11-08 VITALS — Ht 62.5 in | Wt 188.5 lb

## 2022-11-08 DIAGNOSIS — Z9861 Coronary angioplasty status: Secondary | ICD-10-CM | POA: Diagnosis present

## 2022-11-08 DIAGNOSIS — Z955 Presence of coronary angioplasty implant and graft: Secondary | ICD-10-CM | POA: Diagnosis present

## 2022-11-08 NOTE — Patient Instructions (Signed)
Patient Instructions  Patient Details  Name: Vanessa [name] MRN: [id number] Date of Birth: 07-28-1950 Referring Provider:  Liana Crocker,*  Below are your personal goals for exercise, nutrition, and risk factors. Our goal is to help you stay on track towards obtaining and maintaining these goals. We will be discussing your progress on these goals with you throughout the program.  Initial Exercise Prescription:  Initial Exercise Prescription - 11/08/22 1400       Date of Initial Exercise RX and Referring Provider   Date 11/08/22    Referring Provider Cyndia Bent MD   [name], [place]     Oxygen   Maintain Oxygen Saturation 88% or higher      Treadmill   MPH 2.2    Grade 0.5    Minutes 15    METs 2.84      NuStep   Level 2    SPM 80    Minutes 15    METs 2.5      Prescription Details   Frequency (times per week) 3    Duration Progress to 30 minutes of continuous aerobic without signs/symptoms of physical distress      Intensity   THRR 40-80% of Max Heartrate 100-131    Ratings of Perceived Exertion 11-13    Perceived Dyspnea 0-4      Progression   Progression Continue to progress workloads to maintain intensity without signs/symptoms of physical distress.      Resistance Training   Training Prescription Yes    Weight 3 lb    Reps 10-15             Exercise Goals: Frequency: Be able to perform aerobic exercise two to three times per week in program working toward 2-5 days per week of home exercise.  Intensity: Work with a perceived exertion of 11 (fairly light) - 15 (hard) while following your exercise prescription.  We will make changes to your prescription with you as you progress through the program.   Duration: Be able to do 30 to 45 minutes of continuous aerobic exercise in addition to a 5 minute warm-up and a 5 minute cool-down routine.   Nutrition Goals: Your personal nutrition goals will be established when you do your nutrition analysis  with the dietician.  The following are general nutrition guidelines to follow: Cholesterol < 200mg /day Sodium < 1500mg /day Fiber: Women over 50 yrs - 21 grams per day  Personal Goals:  Personal Goals and Risk Factors at Admission - 11/08/22 1454       Core Components/Risk Factors/Patient Goals on Admission    Weight Management Yes;Obesity;Weight Loss    Intervention Weight Management: Develop a combined nutrition and exercise program designed to reach desired caloric intake, while maintaining appropriate intake of nutrient and fiber, sodium and fats, and appropriate energy expenditure required for the weight goal.;Weight Management: Provide education and appropriate resources to help participant work on and attain dietary goals.;Weight Management/Obesity: Establish reasonable short term and long term weight goals.;Obesity: Provide education and appropriate resources to help participant work on and attain dietary goals.    Admit Weight 188 lb 8 oz (85.5 kg)    Goal Weight: Short Term 183 lb (83 kg)    Goal Weight: Long Term 180 lb (81.6 kg)    Expected Outcomes Short Term: Continue to assess and modify interventions until short term weight is achieved;Long Term: Adherence to nutrition and physical activity/exercise program aimed toward attainment of established weight goal;Weight Loss: Understanding of general recommendations  for a balanced deficit meal plan, which promotes 1-2 lb weight loss per week and includes a negative energy balance of [phone] kcal/d;Understanding recommendations for meals to include 15-35% energy as protein, 25-35% energy from fat, 35-60% energy from carbohydrates, less than 200mg  of dietary cholesterol, 20-35 gm of total fiber daily;Understanding of distribution of calorie intake throughout the day with the consumption of 4-5 meals/snacks    Diabetes Yes    Intervention Provide education about signs/symptoms and action to take for hypo/hyperglycemia.;Provide education  about proper nutrition, including hydration, and aerobic/resistive exercise prescription along with prescribed medications to achieve blood glucose in normal ranges: Fasting glucose 65-99 mg/dL    Expected Outcomes Short Term: Participant verbalizes understanding of the signs/symptoms and immediate care of hyper/hypoglycemia, proper foot care and importance of medication, aerobic/resistive exercise and nutrition plan for blood glucose control.;Long Term: Attainment of HbA1C < 7%.    Hypertension Yes    Intervention Provide education on lifestyle modifcations including regular physical activity/exercise, weight management, moderate sodium restriction and increased consumption of fresh fruit, vegetables, and low fat dairy, alcohol moderation, and smoking cessation.;Monitor prescription use compliance.    Expected Outcomes Short Term: Continued assessment and intervention until BP is < 140/83mm HG in hypertensive participants. < 130/9mm HG in hypertensive participants with diabetes, heart failure or chronic kidney disease.;Long Term: Maintenance of blood pressure at goal levels.    Lipids Yes    Intervention Provide education and support for participant on nutrition & aerobic/resistive exercise along with prescribed medications to achieve LDL 70mg , HDL >40mg .    Expected Outcomes Short Term: Participant states understanding of desired cholesterol values and is compliant with medications prescribed. Participant is following exercise prescription and nutrition guidelines.;Long Term: Cholesterol controlled with medications as prescribed, with individualized exercise RX and with personalized nutrition plan. Value goals: LDL < 70mg , HDL > 40 mg.             Tobacco Use Initial Evaluation: Social History   Tobacco Use  Smoking Status Never  Smokeless Tobacco Never    Exercise Goals and Review:  Exercise Goals     Row Name 11/08/22 1440             Exercise Goals   Increase Physical Activity  Yes       Intervention Provide advice, education, support and counseling about physical activity/exercise needs.;Develop an individualized exercise prescription for aerobic and resistive training based on initial evaluation findings, risk stratification, comorbidities and participant's personal goals.       Expected Outcomes Short Term: Attend rehab on a regular basis to increase amount of physical activity.;Long Term: Add in home exercise to make exercise part of routine and to increase amount of physical activity.;Long Term: Exercising regularly at least 3-5 days a week.       Increase Strength and Stamina Yes       Intervention Provide advice, education, support and counseling about physical activity/exercise needs.;Develop an individualized exercise prescription for aerobic and resistive training based on initial evaluation findings, risk stratification, comorbidities and participant's personal goals.       Expected Outcomes Short Term: Increase workloads from initial exercise prescription for resistance, speed, and METs.;Short Term: Perform resistance training exercises routinely during rehab and add in resistance training at home;Long Term: Improve cardiorespiratory fitness, muscular endurance and strength as measured by increased METs and functional capacity ( )       Able to understand and use rate of perceived exertion (RPE) scale Yes  Intervention Provide education and explanation on how to use RPE scale       Expected Outcomes Short Term: Able to use RPE daily in rehab to express subjective intensity level;Long Term:  Able to use RPE to guide intensity level when exercising independently       Able to understand and use Dyspnea scale Yes       Intervention Provide education and explanation on how to use Dyspnea scale       Expected Outcomes Long Term: Able to use Dyspnea scale to guide intensity level when exercising independently;Short Term: Able to use Dyspnea scale daily in rehab to  express subjective sense of shortness of breath during exertion       Knowledge and understanding of Target Heart Rate Range (THRR) Yes       Intervention Provide education and explanation of THRR including how the numbers were predicted and where they are located for reference       Expected Outcomes Short Term: Able to state/look up THRR;Long Term: Able to use THRR to govern intensity when exercising independently;Short Term: Able to use daily as guideline for intensity in rehab       Able to check pulse independently Yes       Intervention Provide education and demonstration on how to check pulse in carotid and radial arteries.;Review the importance of being able to check your own pulse for safety during independent exercise       Expected Outcomes Short Term: Able to explain why pulse checking is important during independent exercise;Long Term: Able to check pulse independently and accurately       Understanding of Exercise Prescription Yes       Intervention Provide education, explanation, and written materials on patient's individual exercise prescription       Expected Outcomes Short Term: Able to explain program exercise prescription;Long Term: Able to explain home exercise prescription to exercise independently              Copy of goals given to participant.

## 2022-11-08 NOTE — Progress Notes (Signed)
Cardiac Individual Treatment Plan  Patient Details  Name: Vanessa [name] MRN: [id number] Date of Birth: Dec 26, 1950 Referring Provider:   Flowsheet Row CARDIAC REHAB PHASE II ORIENTATION from 11/08/2022 in [facility] CARDIAC REHABILITATION  Referring Provider Cyndia Bent MD  [name], [place]]       Initial Encounter Date:  Flowsheet Row CARDIAC REHAB PHASE II ORIENTATION from 11/08/2022 in [facility] CARDIAC REHABILITATION  Date 11/08/22       Visit Diagnosis: Status post coronary artery stent placement  S/P PTCA (percutaneous transluminal coronary angioplasty)  Patient's Home Medications on Admission:  Current Outpatient Medications:    aspirin EC 81 MG tablet, Take 81 mg by mouth daily., Disp: , Rfl:    brimonidine (ALPHAGAN) 0.2 % ophthalmic solution, Place 1 drop into both eyes 3 (three) times daily., Disp: , Rfl:    carvedilol (COREG) 12.5 MG tablet, Take 12.5 mg by mouth 2 (two) times daily with a meal., Disp: , Rfl:    cholecalciferol (VITAMIN D3) 10 MCG (400 UNIT) TABS tablet, Take 1,000 Units by mouth daily in the afternoon., Disp: , Rfl:    diphenhydrAMINE (BENADRYL) 25 mg capsule, Take 25 mg by mouth every 6 (six) hours as needed for itching., Disp: , Rfl:    dorzolamide-timolol (COSOPT) 22.3-6.8 MG/ML ophthalmic solution, Place 1 drop into both eyes 2 (two) times daily., Disp: , Rfl:    DULoxetine (CYMBALTA) 20 MG capsule, Take 20 mg by mouth daily., Disp: , Rfl:    latanoprost (XALATAN) 0.005 % ophthalmic solution, Place 1 drop into both eyes at bedtime., Disp: , Rfl:    Magnesium Oxide, Antacid, 500 MG CAPS, Take 500 mg by mouth daily., Disp: , Rfl:    metFORMIN (GLUCOPHAGE-XR) 500 MG 24 hr tablet, Take 1,000 mg by mouth 2 (two) times daily., Disp: , Rfl:    Netarsudil Dimesylate 0.02 % SOLN, Apply 1 drop to eye at bedtime. Place 1 drop into the right eye at bedtime, Disp: , Rfl:    omeprazole (PRILOSEC) 10 MG capsule, Take 10 mg by mouth daily., Disp: , Rfl:     rosuvastatin (CRESTOR) 40 MG tablet, Take 40 mg by mouth at bedtime., Disp: , Rfl:    sitaGLIPtin (JANUVIA) 100 MG tablet, Take 100 mg by mouth daily., Disp: , Rfl:    traZODone (DESYREL) 100 MG tablet, Take 100 mg by mouth at bedtime., Disp: , Rfl:    Vitamin D, Ergocalciferol, (DRISDOL) 50000 units CAPS capsule, Take 50,000 Units by mouth every Monday., Disp: , Rfl:   Past Medical History: Past Medical History:  Diagnosis Date   Arthritis    Arthropathy, unspecified, site unspecified    Congestive cardiac failure (HCC)    Coronary artery disease    Heart abnormality    hole in heart and 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 according to pt   Other and unspecified noninfectious gastroenteritis and colitis(558.9)    Other specified disease of pancreas    Personal history of urinary calculi    Stroke [facility])    Nov 2012   Type II or unspecified type diabetes mellitus without mention of complication, not stated as uncontrolled    Unspecified asthma(493.90)    Unspecified essential hypertension     Tobacco Use: Social History   Tobacco Use  Smoking Status Never  Smokeless Tobacco Never    Labs: Review Flowsheet       Latest Ref Rng & Units 07/08/2006 02/20/2011  Labs for ITP Cardiac and Pulmonary Rehab  Cholestrol 0 - 200 mg/dL -  191   LDL (calc) 0 - 99 mg/dL - 657   HDL-C >84 mg/dL - 57   Trlycerides <696 mg/dL - 94   Hemoglobin E9B <2.8 % 7.3  7.5     Details            Capillary Blood Glucose: Lab Results  Component Value Date   GLUCAP 211 (H) 07/26/2012   GLUCAP 162 (H) 04/28/2011   GLUCAP 207 (H) 02/22/2011   GLUCAP 110 (H) 02/22/2011   GLUCAP 252 (H) 02/22/2011     Exercise Target Goals: Exercise Program Goal: Individual exercise prescription set using results from initial 6 min walk test and THRR while considering  patient's activity barriers and safety.   Exercise Prescription Goal: Starting with aerobic activity 30 plus minutes a day, 3 days per week for initial  exercise prescription. Provide home exercise prescription and guidelines that participant acknowledges understanding prior to discharge.  Activity Barriers & Risk Stratification:  Activity Barriers & Cardiac Risk Stratification - 11/08/22 1305       Activity Barriers & Cardiac Risk Stratification   Activity Barriers Arthritis;Joint Problems;Deconditioning;Muscular Weakness;Balance Concerns    Cardiac Risk Stratification Moderate             6 Minute Walk:  6 Minute Walk     Row Name 11/08/22 1432         6 Minute Walk   Phase Initial     Distance 1238 feet     Walk Time 6 minutes     # of Rest Breaks 0     MPH 2.34     METS 2.26     RPE 10     VO2 Peak 7.92     Symptoms No     Resting HR 68 bpm     Resting BP 118/56     Resting Oxygen Saturation  99 %     Exercise Oxygen Saturation  during 6 min walk 100 %     Max Ex. HR 107 bpm     Max Ex. BP 128/64     2 Minute Post BP 120/64              Oxygen Initial Assessment:   Oxygen Re-Evaluation:   Oxygen Discharge (Final Oxygen Re-Evaluation):   Initial Exercise Prescription:  Initial Exercise Prescription - 11/08/22 1400       Date of Initial Exercise RX and Referring Provider   Date 11/08/22    Referring Provider Cyndia Bent MD   [name], [place]     Oxygen   Maintain Oxygen Saturation 88% or higher      Treadmill   MPH 2.2    Grade 0.5    Minutes 15    METs 2.84      NuStep   Level 2    SPM 80    Minutes 15    METs 2.5      Prescription Details   Frequency (times per week) 3    Duration Progress to 30 minutes of continuous aerobic without signs/symptoms of physical distress      Intensity   THRR 40-80% of Max Heartrate 100-131    Ratings of Perceived Exertion 11-13    Perceived Dyspnea 0-4      Progression   Progression Continue to progress workloads to maintain intensity without signs/symptoms of physical distress.      Resistance Training   Training Prescription Yes     Weight 3 lb    Reps 10-15  Perform Capillary Blood Glucose checks as needed.  Exercise Prescription Changes:   Exercise Prescription Changes     Row Name 11/08/22 1400             Response to Exercise   Blood Pressure (Admit) 118/56       Blood Pressure (Exercise) 128/64       Blood Pressure (Exit) 120/64       Heart Rate (Admit) 68 bpm       Heart Rate (Exercise) 107 bpm       Heart Rate (Exit) 70 bpm       Oxygen Saturation (Admit) 99 %       Oxygen Saturation (Exercise) 100 %       Rating of Perceived Exertion (Exercise) 10       Symptoms none       Comments walk test results                Exercise Comments:   Exercise Goals and Review:   Exercise Goals     Row Name 11/08/22 1440             Exercise Goals   Increase Physical Activity Yes       Intervention Provide advice, education, support and counseling about physical activity/exercise needs.;Develop an individualized exercise prescription for aerobic and resistive training based on initial evaluation findings, risk stratification, comorbidities and participant's personal goals.       Expected Outcomes Short Term: Attend rehab on a regular basis to increase amount of physical activity.;Long Term: Add in home exercise to make exercise part of routine and to increase amount of physical activity.;Long Term: Exercising regularly at least 3-5 days a week.       Increase Strength and Stamina Yes       Intervention Provide advice, education, support and counseling about physical activity/exercise needs.;Develop an individualized exercise prescription for aerobic and resistive training based on initial evaluation findings, risk stratification, comorbidities and participant's personal goals.       Expected Outcomes Short Term: Increase workloads from initial exercise prescription for resistance, speed, and METs.;Short Term: Perform resistance training exercises routinely during rehab and add in  resistance training at home;Long Term: Improve cardiorespiratory fitness, muscular endurance and strength as measured by increased METs and functional capacity ( )       Able to understand and use rate of perceived exertion (RPE) scale Yes       Intervention Provide education and explanation on how to use RPE scale       Expected Outcomes Short Term: Able to use RPE daily in rehab to express subjective intensity level;Long Term:  Able to use RPE to guide intensity level when exercising independently       Able to understand and use Dyspnea scale Yes       Intervention Provide education and explanation on how to use Dyspnea scale       Expected Outcomes Long Term: Able to use Dyspnea scale to guide intensity level when exercising independently;Short Term: Able to use Dyspnea scale daily in rehab to express subjective sense of shortness of breath during exertion       Knowledge and understanding of Target Heart Rate Range (THRR) Yes       Intervention Provide education and explanation of THRR including how the numbers were predicted and where they are located for reference       Expected Outcomes Short Term: Able to state/look up THRR;Long Term: Able to use THRR to  govern intensity when exercising independently;Short Term: Able to use daily as guideline for intensity in rehab       Able to check pulse independently Yes       Intervention Provide education and demonstration on how to check pulse in carotid and radial arteries.;Review the importance of being able to check your own pulse for safety during independent exercise       Expected Outcomes Short Term: Able to explain why pulse checking is important during independent exercise;Long Term: Able to check pulse independently and accurately       Understanding of Exercise Prescription Yes       Intervention Provide education, explanation, and written materials on patient's individual exercise prescription       Expected Outcomes Short Term: Able to  explain program exercise prescription;Long Term: Able to explain home exercise prescription to exercise independently                Exercise Goals Re-Evaluation :  Exercise Goals Re-Evaluation     Row Name 11/08/22 1442             Exercise Goal Re-Evaluation   Exercise Goals Review Able to understand and use Dyspnea scale;Able to understand and use rate of perceived exertion (RPE) scale;Understanding of Exercise Prescription       Comments Reviewed RPE  and dyspnea scale,and program prescription with pt today.  Pt voiced understanding and was given a copy of goals to take home.       Expected Outcomes Short: Use RPE daily to regulate intensity.  Long: Follow program prescription                 Discharge Exercise Prescription (Final Exercise Prescription Changes):  Exercise Prescription Changes - 11/08/22 1400       Response to Exercise   Blood Pressure (Admit) 118/56    Blood Pressure (Exercise) 128/64    Blood Pressure (Exit) 120/64    Heart Rate (Admit) 68 bpm    Heart Rate (Exercise) 107 bpm    Heart Rate (Exit) 70 bpm    Oxygen Saturation (Admit) 99 %    Oxygen Saturation (Exercise) 100 %    Rating of Perceived Exertion (Exercise) 10    Symptoms none    Comments walk test results             Nutrition:  Target Goals: Understanding of nutrition guidelines, daily intake of sodium 1500mg , cholesterol 200mg , calories 30% from fat and 7% or less from saturated fats, daily to have 5 or more servings of fruits and vegetables.  Biometrics:  Pre Biometrics - 11/08/22 1443       Pre Biometrics   Height 5' 2.5" (1.588 m)    Weight 85.5 kg    Waist Circumference 38.75 inches    Hip Circumference 45 inches    Waist to Hip Ratio 0.86 %    BMI (Calculated) 33.91    Grip Strength 12.8 kg    Single Leg Stand 2.1 seconds              Nutrition Therapy Plan and Nutrition Goals:  Nutrition Therapy & Goals - 11/08/22 1453       Nutrition Therapy    Diet Currently follow [place]iter[place] Diet      Intervention Plan   Intervention Prescribe, educate and counsel regarding individualized specific dietary modifications aiming towards targeted core components such as weight, hypertension, lipid management, diabetes, heart failure and other comorbidities.;Nutrition handout(s) given to patient.  Expected Outcomes Short Term Goal: Understand basic principles of dietary content, such as calories, fat, sodium, cholesterol and nutrients.             Nutrition Assessments:  MEDIFICTS Score Key: ?70 Need to make dietary changes  40-70 Heart Healthy Diet ? 40 Therapeutic Level Cholesterol Diet  Flowsheet Row CARDIAC REHAB PHASE II ORIENTATION from 11/08/2022 in [facility] CARDIAC RE[facility]ILIT[facility]  Picture Your Plate Total Score on Admission 66      Picture Your Plate Scores: <16 Unhealthy dietary pattern with much room for improvement. 41-50 Dietary pattern unlikely to meet recommendations for good health and room for improvement. 51-60 More healthful dietary pattern, with some room for improvement.  >60 Healthy dietary pattern, although there may be some specific behaviors that could be improved.    Nutrition Goals Re-Evaluation:   Nutrition Goals Discharge (Final Nutrition Goals Re-Evaluation):   Psychosocial: Target Goals: Acknowledge presence or absence of significant depression and/or stress, maximize coping skills, provide positive support system. Participant is able to verbalize types and ability to use techniques and skills needed for reducing stress and depression.  Initial Review & Psychosocial Screening:  Initial Psych Review & Screening - 11/08/22 1307       Initial Review   Current issues with Current Stress Concerns    Source of Stress Concerns Chronic Illness;Family;Unable to participate in former interests or hobbies;Unable to perform yard/household activities    Comments daughter in [place] (just lost husband), tries not to  let things get to her, goes to senior center in [place], would like to get back to volunteering, good sleeper since stomach surgery, tends to forget about herself at times      [facility]   Good Support System? Yes   daughter, friends at [employer], pastor at [employer]   Psychosocial barriers to participate in program The patient should benefit from training in stress management and relaxation.;Psychosocial barriers identified (see note)      Screening Interventions   Interventions Encouraged to exercise;Provide feedback about the scores to participant;To provide support and resources with identified psychosocial needs    Expected Outcomes Short Term goal: Utilizing psychosocial counselor, staff and physician to assist with identification of specific Stressors or current issues interfering with healing process. Setting desired goal for each stressor or current issue identified.;Long Term Goal: Stressors or current issues are controlled or eliminated.;Short Term goal: Identification and review with participant of any Quality of Life or Depression concerns found by scoring the questionnaire.;Long Term goal: The participant improves quality of Life and PHQ9 Scores as seen by post scores and/or verbalization of changes             Quality of Life Scores:  Quality of Life - 11/08/22 1453       Quality of Life   Select Quality of Life      Quality of Life Scores   Health/Function Pre 28 %    Socioeconomic Pre 30 %    Psych/Spiritual Pre 30 %    Family Pre 30 %    GLOBAL Pre 29.12 %            Scores of 19 and below usually indicate a poorer quality of life in these areas.  A difference of  2-3 points is a clinically meaningful difference.  A difference of 2-3 points in the total score of the Quality of Life Index has been associated with significant improvement in overall quality of life, self-image, physical  symptoms, and general health in studies assessing change in  quality of life.  PHQ-9: Review Flowsheet       11/08/2022  Depression screen PHQ 2/9  Decreased Interest 1  Down, Depressed, Hopeless 0  PHQ - 2 Score 1  Altered sleeping 2  Tired, decreased energy 1  Change in appetite 0  Feeling bad or failure about yourself  0  Trouble concentrating 0  Moving slowly or fidgety/restless 0  Suicidal thoughts 0  PHQ-9 Score 4  Difficult doing work/chores Not difficult at all    Details           Interpretation of Total Score  Total Score Depression Severity:  1-4 = Minimal depression, 5-9 = Mild depression, 10-14 = Moderate depression, 15-19 = Moderately severe depression, 20-27 = Severe depression   Psychosocial Evaluation and Intervention:  Psychosocial Evaluation - 11/08/22 1444       Psychosocial Evaluation & Interventions   Interventions Encouraged to exercise with the program and follow exercise prescription    Comments Vetra is coming to cardiac rehab after a stent and PTCA.  She is super excited to start rehab and feels that she is going to get a lot out of it.  She has recently lost a lot of weight through diet and exercise on her own.  She wants to get stronger and build her stamina back up.  She also wants to boost her confidence in her heart as well.  She is a very independently woman and was discouraged when she had to go live with her daughter for a while after surgery.  She missed her independence and is eager to get it all back.  While living with her daughter in [place], her daughter's husband passed, so they were able to be together and lift each other back up.  Yutong is very acitve in the [employer] and at the [employer].  She has friends everywhere that look in on her.  She goes [facility] exercise classes at [facility]. She is planning to use transportation to get to rehab and will get that set up today.  Otherwise, she has no barriers to attending rehab.  She is a happy person and sleeps well most nights.  She has a history of  some anxiety but currently doing well.    Expected Outcomes Short: Attend rehab to boost stamina Long; conitnue to exercise for mental boost    Continue Psychosocial Services  Follow up required by staff             Psychosocial Re-Evaluation:   Psychosocial Discharge (Final Psychosocial Re-Evaluation):   Vocational Rehabilitation: Provide vocational rehab assistance to qualifying candidates.   Vocational Rehab Evaluation & Intervention:  Vocational Rehab - 11/08/22 1306       Initial Vocational Rehab Evaluation & Intervention   Assessment shows need for Vocational Rehabilitation No   retired            Education: Education Goals: Education classes will be provided on a weekly basis, covering required topics. Participant will state understanding/return demonstration of topics presented.  Learning Barriers/Preferences:  Learning Barriers/Preferences - 11/08/22 1305       Learning Barriers/Preferences   Learning Barriers Sight   glasses   Learning Preferences Skilled Demonstration             Education Topics: Hypertension, Hypertension Reduction -Define heart disease and high blood pressure. Discus how high blood pressure affects the body and ways to reduce high blood pressure.  Exercise and Your Heart -Discuss why it is important to exercise, the FITT principles of exercise, normal and abnormal responses to exercise, and how to exercise safely.   Angina -Discuss definition of angina, causes of angina, treatment of angina, and how to decrease risk of having angina.   Cardiac Medications -Review what the following cardiac medications are used for, how they affect the body, and side effects that may occur when taking the medications.  Medications include Aspirin, Beta blockers, calcium channel blockers, ACE Inhibitors, angiotensin receptor blockers, diuretics, digoxin, and antihyperlipidemics.   Congestive Heart Failure -Discuss the definition of CHF,  how to live with CHF, the signs and symptoms of CHF, and how keep track of weight and sodium intake.   Heart Disease and Intimacy -Discus the effect sexual activity has on the heart, how changes occur during intimacy as we age, and safety during sexual activity.   Smoking Cessation / COPD -Discuss different methods to quit smoking, the health benefits of quitting smoking, and the definition of COPD.   Nutrition I: Fats -Discuss the types of cholesterol, what cholesterol does to the heart, and how cholesterol levels can be controlled.   Nutrition II: Labels -Discuss the different components of food labels and how to read food label   Heart Parts/Heart Disease and PAD -Discuss the anatomy of the heart, the pathway of blood circulation through the heart, and these are affected by heart disease.   Stress I: Signs and Symptoms -Discuss the causes of stress, how stress may lead to anxiety and depression, and ways to limit stress.   Stress II: Relaxation -Discuss different types of relaxation techniques to limit stress.   Warning Signs of Stroke / TIA -Discuss definition of a stroke, what the signs and symptoms are of a stroke, and how to identify when someone is having stroke.   Knowledge Questionnaire Score:  Knowledge Questionnaire Score - 11/08/22 1454       Knowledge Questionnaire Score   Pre Score 18/24             Core Components/Risk Factors/Patient Goals at Admission:  Personal Goals and Risk Factors at Admission - 11/08/22 1454       Core Components/Risk Factors/Patient Goals on Admission    Weight Management Yes;Obesity;Weight Loss    Intervention Weight Management: Develop a combined nutrition and exercise program designed to reach desired caloric intake, while maintaining appropriate intake of nutrient and fiber, sodium and fats, and appropriate energy expenditure required for the weight goal.;Weight Management: Provide education and appropriate resources  to help participant work on and attain dietary goals.;Weight Management/Obesity: Establish reasonable short term and long term weight goals.;Obesity: Provide education and appropriate resources to help participant work on and attain dietary goals.    Admit Weight 188 lb 8 oz (85.5 kg)    Goal Weight: Short Term 183 lb (83 kg)    Goal Weight: Long Term 180 lb (81.6 kg)    Expected Outcomes Short Term: Continue to assess and modify interventions until short term weight is achieved;Long Term: Adherence to nutrition and physical activity/exercise program aimed toward attainment of established weight goal;Weight Loss: Understanding of general recommendations for a balanced deficit meal plan, which promotes 1-2 lb weight loss per week and includes a negative energy balance of [phone] kcal/d;Understanding recommendations for meals to include 15-35% energy as protein, 25-35% energy from fat, 35-60% energy from carbohydrates, less than 200mg  of dietary cholesterol, 20-35 gm of total fiber daily;Understanding of distribution of calorie intake throughout the day  with the consumption of 4-5 meals/snacks    Diabetes Yes    Intervention Provide education about signs/symptoms and action to take for hypo/hyperglycemia.;Provide education about proper nutrition, including hydration, and aerobic/resistive exercise prescription along with prescribed medications to achieve blood glucose in normal ranges: Fasting glucose 65-99 mg/dL    Expected Outcomes Short Term: Participant verbalizes understanding of the signs/symptoms and immediate care of hyper/hypoglycemia, proper foot care and importance of medication, aerobic/resistive exercise and nutrition plan for blood glucose control.;Long Term: Attainment of HbA1C < 7%.    Hypertension Yes    Intervention Provide education on lifestyle modifcations including regular physical activity/exercise, weight management, moderate sodium restriction and increased consumption of fresh  fruit, vegetables, and low fat dairy, alcohol moderation, and smoking cessation.;Monitor prescription use compliance.    Expected Outcomes Short Term: Continued assessment and intervention until BP is < 140/22mm HG in hypertensive participants. < 130/99mm HG in hypertensive participants with diabetes, heart failure or chronic kidney disease.;Long Term: Maintenance of blood pressure at goal levels.    Lipids Yes    Intervention Provide education and support for participant on nutrition & aerobic/resistive exercise along with prescribed medications to achieve LDL 70mg , HDL >40mg .    Expected Outcomes Short Term: Participant states understanding of desired cholesterol values and is compliant with medications prescribed. Participant is following exercise prescription and nutrition guidelines.;Long Term: Cholesterol controlled with medications as prescribed, with individualized exercise RX and with personalized nutrition plan. Value goals: LDL < 70mg , HDL > 40 mg.             Core Components/Risk Factors/Patient Goals Review:    Core Components/Risk Factors/Patient Goals at Discharge (Final Review):    ITP Comments:  ITP Comments     Row Name 11/08/22 1428           ITP Comments Patient attend orientation today.  Patient is attendingCardiac Rehabilitation Program.  Documentation for diagnosis can be found in [facility] 08/10/22.  Reviewed medical chart, RPE/RPD, gym safety, and program guidelines.  Patient was fitted to equipment they will be using during rehab.  Patient is scheduled to start exercise on 11/12/22 at 1445.   Initial ITP created and sent for review and signature by Dr. [name], Medical Director for Cardiac Rehabilitation Program.                Comments: Initial ITP

## 2022-11-12 ENCOUNTER — Ambulatory Visit (HOSPITAL_COMMUNITY)
Admission: RE | Admit: 2022-11-12 | Discharge: 2022-11-12 | Disposition: A | Discharge status: Home / Self Care | Payer: Medicare HMO | Source: Ambulatory Visit | Attending: Internal Medicine | Admitting: Internal Medicine

## 2022-11-12 DIAGNOSIS — Z9861 Coronary angioplasty status: Secondary | ICD-10-CM | POA: Diagnosis present

## 2022-11-12 DIAGNOSIS — Z955 Presence of coronary angioplasty implant and graft: Secondary | ICD-10-CM | POA: Insufficient documentation

## 2022-11-12 NOTE — Progress Notes (Signed)
Daily Session Note  Patient Details  Name: Vanessa [name] MRN: [id number] Date of Birth: 1950/06/18 Referring Provider:   Flowsheet Row CARDIAC REHAB PHASE II ORIENTATION from 11/08/2022 in [facility] CARDIAC REHABILITATION  Referring Provider Cyndia Bent MD  [name], PA]       Encounter Date: 11/12/2022  Check In:  Session Check In - 11/12/22 1509       Check-In   Supervising physician immediately available to respond to emergencies See telemetry face sheet for immediately available MD    Location [facility]-Cardiac & Pulmonary Rehab    Staff Present Fabio Pierce, MA, RCEP, CCRP, CCET;Phyllis Billingsley, RN    Virtual Visit No    Medication changes reported     No    Warm-up and Cool-down Performed on first and last piece of equipment    Resistance Training Performed Yes    VAD Patient? No    PAD/SET Patient? No      Pain Assessment   Currently in Pain? No/denies             Capillary Blood Glucose: No results found for this or any previous visit (from the past 24 hour(s)).    Social History   Tobacco Use  Smoking Status Never  Smokeless Tobacco Never    Goals Met:  Exercise tolerated well Personal goals reviewed No report of concerns or symptoms today Strength training completed today  Goals Unmet:  Not Applicable  Comments: First full day of exercise!  Patient was oriented to gym and equipment including functions, settings, policies, and procedures.  Patient's individual exercise prescription and treatment plan were reviewed.  All starting workloads were established based on the results of the 6 minute walk test done at initial orientation visit.  The plan for exercise progression was also introduced and progression will be customized based on patient's performance and goals.    Dr. [name] is Medical Director for [facility] Cardiac Rehab

## 2022-11-14 ENCOUNTER — Encounter (HOSPITAL_COMMUNITY): Admission: RE | Admit: 2022-11-14 | Payer: Medicare HMO | Source: Ambulatory Visit

## 2022-11-14 DIAGNOSIS — Z955 Presence of coronary angioplasty implant and graft: Secondary | ICD-10-CM | POA: Diagnosis not present

## 2022-11-14 DIAGNOSIS — Z9861 Coronary angioplasty status: Secondary | ICD-10-CM

## 2022-11-14 NOTE — Progress Notes (Signed)
Daily Session Note  Patient Details  Name: LATONDRA MAGEL MRN: [id number] Date of Birth: 03/15/1951 Referring Provider:   Flowsheet Row CARDIAC REHAB PHASE II ORIENTATION from 11/08/2022 in [facility] CARDIAC REHABILITATION  Referring Provider Cyndia Bent MD  [name], PA]       Encounter Date: 11/14/2022  Check In:  Session Check In - 11/14/22 1445       Check-In   Supervising physician immediately available to respond to emergencies See telemetry face sheet for immediately available MD    Location [facility]    Staff Present Fabio Pierce, MA, RCEP, CCRP, Harolyn Rutherford, RN, BSN;Hillary Troutman BSN, RN    Virtual Visit No    Medication changes reported     No    Fall or balance concerns reported    No    Tobacco Cessation No Change    Warm-up and Cool-down Performed on first and last piece of equipment    Resistance Training Performed Yes    VAD Patient? No      Pain Assessment   Currently in Pain? No/denies             Capillary Blood Glucose: No results found for this or any previous visit (from the past 24 hour(s)).    Social History   Tobacco Use  Smoking Status Never  Smokeless Tobacco Never    Goals Met:  Independence with exercise equipment Exercise tolerated well No report of concerns or symptoms today Strength training completed today  Goals Unmet:  Not Applicable  Comments: Pt able to follow exercise prescription today without complaint.  Will continue to monitor for progression.    Dr. [name] is Medical Director for [facility] Cardiac Rehab

## 2022-11-16 ENCOUNTER — Encounter (HOSPITAL_COMMUNITY)
Admission: RE | Admit: 2022-11-16 | Discharge: 2022-11-16 | Disposition: A | Discharge status: Home / Self Care | Payer: Medicare HMO | Source: Ambulatory Visit | Attending: Internal Medicine | Admitting: Internal Medicine

## 2022-11-16 DIAGNOSIS — Z955 Presence of coronary angioplasty implant and graft: Secondary | ICD-10-CM | POA: Diagnosis not present

## 2022-11-16 DIAGNOSIS — Z9861 Coronary angioplasty status: Secondary | ICD-10-CM

## 2022-11-16 NOTE — Progress Notes (Signed)
Daily Session Note  Patient Details  Name: BRENLIE CZAPLA MRN: [id number] Date of Birth: 07-May-1950 Referring Provider:   Flowsheet Row CARDIAC REHAB PHASE II ORIENTATION from 11/08/2022 in [facility] CARDIAC REHABILITATION  Referring Provider Cyndia Bent MD  [name], PA]       Encounter Date: 11/16/2022  Check In:  Session Check In - 11/16/22 1424       Check-In   Supervising physician immediately available to respond to emergencies See telemetry face sheet for immediately available MD    Staff Present Rodena Medin, RN, Pleas Koch, RN, BSN    Virtual Visit No    Medication changes reported     No    Fall or balance concerns reported    No    Tobacco Cessation No Change    Warm-up and Cool-down Performed on first and last piece of equipment    Resistance Training Performed Yes    VAD Patient? No      Pain Assessment   Currently in Pain? No/denies             Capillary Blood Glucose: No results found for this or any previous visit (from the past 24 hour(s)).    Social History   Tobacco Use  Smoking Status Never  Smokeless Tobacco Never    Goals Met:  Independence with exercise equipment Exercise tolerated well No report of concerns or symptoms today Strength training completed today  Goals Unmet:  Not Applicable  Comments: Pt able to follow exercise prescription today without complaint.  Will continue to monitor for progression.    Dr. [name] is Medical Director for [facility] Cardiac Rehab

## 2022-11-19 ENCOUNTER — Encounter (HOSPITAL_COMMUNITY)
Admission: RE | Admit: 2022-11-19 | Discharge: 2022-11-19 | Disposition: A | Discharge status: Home / Self Care | Payer: Medicare HMO | Source: Ambulatory Visit | Attending: Internal Medicine | Admitting: Internal Medicine

## 2022-11-19 DIAGNOSIS — Z9861 Coronary angioplasty status: Secondary | ICD-10-CM

## 2022-11-19 DIAGNOSIS — Z955 Presence of coronary angioplasty implant and graft: Secondary | ICD-10-CM | POA: Diagnosis not present

## 2022-11-19 LAB — GLUCOSE, CAPILLARY
Glucose-Capillary: 131 mg/dL — ABNORMAL HIGH (ref 70–99)
Glucose-Capillary: 119 mg/dL — ABNORMAL HIGH (ref 70–99)

## 2022-11-19 NOTE — Progress Notes (Signed)
Daily Session Note  Patient Details  Name: Vanessa [name] MRN: [id number] Date of Birth: 10/29/1950 Referring Provider:   Flowsheet Row CARDIAC REHAB PHASE II ORIENTATION from 11/08/2022 in [facility] CARDIAC REHABILITATION  Referring Provider Cyndia Bent MD  [name], PA]       Encounter Date: 11/19/2022  Check In:  Session Check In - 11/19/22 1420       Check-In   Supervising physician immediately available to respond to emergencies See telemetry face sheet for immediately available ER MD    Location [facility]    Staff Present Staci Righter, RN, Neal Dy, RN, BSN;Jessica Juanetta Gosling, MA, RCEP, CCRP, CCET;Heather Fredric Mare, [place], Exercise Physiologist    Virtual Visit No    Medication changes reported     No    Fall or balance concerns reported    No    Warm-up and Cool-down Performed on first and last piece of equipment    Resistance Training Performed Yes    VAD Patient? No    PAD/SET Patient? No      Pain Assessment   Currently in Pain? No/denies    Multiple Pain Sites No             Capillary Blood Glucose: Results for orders placed or performed during the hospital encounter of 11/19/22 (from the past 24 hour(s))  Glucose, capillary     Status: Abnormal   Collection Time: 11/19/22  2:37 PM  Result Value Ref Range   Glucose-Capillary 131 (H) 70 - 99 mg/dL      Social History   Tobacco Use  Smoking Status Never  Smokeless Tobacco Never    Goals Met:  Independence with exercise equipment Exercise tolerated well No report of concerns or symptoms today Strength training completed today  Goals Unmet:  Not Applicable  Comments: Pt able to follow exercise prescription today without complaint.  Will continue to monitor for progression.    Dr. [name] is Medical Director for [facility] Cardiac Rehab

## 2022-11-21 ENCOUNTER — Encounter (HOSPITAL_COMMUNITY): Admission: RE | Admit: 2022-11-21 | Payer: Medicare HMO | Source: Ambulatory Visit

## 2022-11-21 ENCOUNTER — Encounter (HOSPITAL_COMMUNITY): Payer: Self-pay | Admitting: *Deleted

## 2022-11-21 ENCOUNTER — Encounter (HOSPITAL_COMMUNITY): Payer: Medicare HMO

## 2022-11-21 DIAGNOSIS — Z955 Presence of coronary angioplasty implant and graft: Secondary | ICD-10-CM

## 2022-11-21 DIAGNOSIS — Z9861 Coronary angioplasty status: Secondary | ICD-10-CM

## 2022-11-21 LAB — GLUCOSE, CAPILLARY
Glucose-Capillary: 148 mg/dL — ABNORMAL HIGH (ref 70–99)
Glucose-Capillary: 89 mg/dL (ref 70–99)

## 2022-11-21 NOTE — Progress Notes (Signed)
Daily Session Note  Patient Details  Name: Vanessa [name] MRN: [id number] Date of Birth: [birth year] Referring Provider:   Flowsheet Row CARDIAC REHAB PHASE II ORIENTATION from 11/08/2022 in [facility] CARDIAC REHABILITATION  Referring Provider Cyndia Bent MD  [name], PA]       Encounter Date: 11/21/2022  Check In:  Session Check In - 11/21/22 1400       Check-In   Supervising physician immediately available to respond to emergencies [facility] MD immediately available    Physician(s) Dr. Wyline Mood    Location [facility]    Staff Present Ross Ludwig, BS, Exercise Physiologist;Valisha Heslin BSN, RN;Daphyne Daphine Deutscher, RN, BSN    Virtual Visit No    Medication changes reported     No    Fall or balance concerns reported    No    Tobacco Cessation No Change    Warm-up and Cool-down Performed on first and last piece of equipment    Resistance Training Performed Yes    VAD Patient? No    PAD/SET Patient? No      Pain Assessment   Currently in Pain? No/denies    Multiple Pain Sites No             Capillary Blood Glucose: Results for orders placed or performed during the hospital encounter of 11/21/22 (from the past 24 hour(s))  Glucose, capillary     Status: Abnormal   Collection Time: 11/21/22  2:24 PM  Result Value Ref Range   Glucose-Capillary 148 (H) 70 - 99 mg/dL      Social History   Tobacco Use  Smoking Status Never  Smokeless Tobacco Never    Goals Met:  Independence with exercise equipment Exercise tolerated well No report of concerns or symptoms today Strength training completed today  Goals Unmet:  Not Applicable  Comments: .Pt able to follow exercise prescription today without complaint.  Will continue to monitor for progression.     Dr. [name] is Medical Director for [facility] Cardiac Rehab

## 2022-11-21 NOTE — Progress Notes (Addendum)
Cardiac Individual Treatment Plan  Patient Details  Name: VALLERY KOSANKE MRN: [id number] Date of Birth: [birth year] Referring Provider:   Flowsheet Row CARDIAC REHAB PHASE II ORIENTATION from 11/08/2022 in [facility] CARDIAC REHABILITATION  Referring Provider Cyndia Bent MD  [name], [place]]       Initial Encounter Date:  Flowsheet Row CARDIAC REHAB PHASE II ORIENTATION from 11/08/2022 in [place] CARDIAC REHABILITATION  Date 11/08/22       Visit Diagnosis: Status post coronary artery stent placement  S/P PTCA (percutaneous transluminal coronary angioplasty)  Patient's Home Medications on Admission:  Current Outpatient Medications:    aspirin EC 81 MG tablet, Take 81 mg by mouth daily., Disp: , Rfl:    brimonidine (ALPHAGAN) 0.2 % ophthalmic solution, Place 1 drop into both eyes 3 (three) times daily., Disp: , Rfl:    carvedilol (COREG) 12.5 MG tablet, Take 12.5 mg by mouth 2 (two) times daily with a meal., Disp: , Rfl:    cholecalciferol (VITAMIN D3) 10 MCG (400 UNIT) TABS tablet, Take 1,000 Units by mouth daily in the afternoon., Disp: , Rfl:    diphenhydrAMINE (BENADRYL) 25 mg capsule, Take 25 mg by mouth every 6 (six) hours as needed for itching., Disp: , Rfl:    dorzolamide-timolol (COSOPT) 22.3-6.8 MG/ML ophthalmic solution, Place 1 drop into both eyes 2 (two) times daily., Disp: , Rfl:    DULoxetine (CYMBALTA) 20 MG capsule, Take 20 mg by mouth daily., Disp: , Rfl:    latanoprost (XALATAN) 0.005 % ophthalmic solution, Place 1 drop into both eyes at bedtime., Disp: , Rfl:    Magnesium Oxide, Antacid, 500 MG CAPS, Take 500 mg by mouth daily., Disp: , Rfl:    metFORMIN (GLUCOPHAGE-XR) 500 MG 24 hr tablet, Take 1,000 mg by mouth 2 (two) times daily., Disp: , Rfl:    Netarsudil Dimesylate 0.02 % SOLN, Apply 1 drop to eye at bedtime. Place 1 drop into the right eye at bedtime, Disp: , Rfl:    omeprazole (PRILOSEC) 10 MG capsule, Take 10 mg by mouth daily., Disp: , Rfl:     rosuvastatin (CRESTOR) 40 MG tablet, Take 40 mg by mouth at bedtime., Disp: , Rfl:    sitaGLIPtin (JANUVIA) 100 MG tablet, Take 100 mg by mouth daily., Disp: , Rfl:    traZODone (DESYREL) 100 MG tablet, Take 100 mg by mouth at bedtime., Disp: , Rfl:    Vitamin D, Ergocalciferol, (DRISDOL) 50000 units CAPS capsule, Take 50,000 Units by mouth every Monday., Disp: , Rfl:   Past Medical History: Past Medical History:  Diagnosis Date   Arthritis    Arthropathy, unspecified, site unspecified    Congestive cardiac failure (HCC)    Coronary artery disease    Heart abnormality    hole in heart and 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 according to pt   Other and unspecified noninfectious gastroenteritis and colitis(558.9)    Other specified disease of pancreas    Personal history of urinary calculi    Stroke [facility])    Nov 2012   Type II or unspecified type diabetes mellitus without mention of complication, not stated as uncontrolled    Unspecified asthma(493.90)    Unspecified essential hypertension     Tobacco Use: Social History   Tobacco Use  Smoking Status Never  Smokeless Tobacco Never    Labs: Review Flowsheet       Latest Ref Rng & Units 07/08/2006 02/20/2011  Labs for ITP Cardiac and Pulmonary Rehab  Cholestrol 0 - 200 mg/dL -  191   LDL (calc) 0 - 99 mg/dL - 098   HDL-C >11 mg/dL - 57   Trlycerides <914 mg/dL - 94   Hemoglobin N8G <9.5 % 7.3  7.5     Details            Capillary Blood Glucose: Lab Results  Component Value Date   GLUCAP 119 (H) 11/19/2022   GLUCAP 131 (H) 11/19/2022   GLUCAP 211 (H) 07/26/2012   GLUCAP 162 (H) 04/28/2011   GLUCAP 207 (H) 02/22/2011     Exercise Target Goals: Exercise Program Goal: Individual exercise prescription set using results from initial 6 min walk test and THRR while considering  patient's activity barriers and safety.   Exercise Prescription Goal: Starting with aerobic activity 30 plus minutes a day, 3 days per week for initial  exercise prescription. Provide home exercise prescription and guidelines that participant acknowledges understanding prior to discharge.  Activity Barriers & Risk Stratification:  Activity Barriers & Cardiac Risk Stratification - 11/08/22 1305       Activity Barriers & Cardiac Risk Stratification   Activity Barriers Arthritis;Joint Problems;Deconditioning;Muscular Weakness;Balance Concerns    Cardiac Risk Stratification Moderate             6 Minute Walk:  6 Minute Walk     Row Name 11/08/22 1432         6 Minute Walk   Phase Initial     Distance 1238 feet     Walk Time 6 minutes     # of Rest Breaks 0     MPH 2.34     METS 2.26     RPE 10     VO2 Peak 7.92     Symptoms No     Resting HR 68 bpm     Resting BP 118/56     Resting Oxygen Saturation  99 %     Exercise Oxygen Saturation  during 6 min walk 100 %     Max Ex. HR 107 bpm     Max Ex. BP 128/64     2 Minute Post BP 120/64              6 Minute Walk     Row Name 11/08/22 1432         6 Minute Walk   Phase Initial     Distance 1238 feet     Walk Time 6 minutes     # of Rest Breaks 0     MPH 2.34     METS 2.26     RPE 10     VO2 Peak 7.92     Symptoms No     Resting HR 68 bpm     Resting BP 118/56     Resting Oxygen Saturation  99 %     Exercise Oxygen Saturation  during 6 min walk 100 %     Max Ex. HR 107 bpm     Max Ex. BP 128/64     2 Minute Post BP 120/64              Oxygen Initial Assessment:   Oxygen Re-Evaluation:   Oxygen Discharge (Final Oxygen Re-Evaluation):   Initial Exercise Prescription:  Initial Exercise Prescription - 11/08/22 1400       Date of Initial Exercise RX and Referring Provider   Date 11/08/22    Referring Provider Cyndia Bent MD   [name], [place]     Oxygen   Maintain Oxygen  Saturation 88% or higher      Treadmill   MPH 2.2    Grade 0.5    Minutes 15    METs 2.84      NuStep   Level 2    SPM 80    Minutes 15    METs 2.5       Prescription Details   Frequency (times per week) 3    Duration Progress to 30 minutes of continuous aerobic without signs/symptoms of physical distress      Intensity   THRR 40-80% of Max Heartrate 100-131    Ratings of Perceived Exertion 11-13    Perceived Dyspnea 0-4      Progression   Progression Continue to progress workloads to maintain intensity without signs/symptoms of physical distress.      Resistance Training   Training Prescription Yes    Weight 3 lb    Reps 10-15             Perform Capillary Blood Glucose checks as needed.  Exercise Prescription Changes:   Exercise Prescription Changes     Row Name 11/08/22 1400             Response to Exercise   Blood Pressure (Admit) 118/56       Blood Pressure (Exercise) 128/64       Blood Pressure (Exit) 120/64       Heart Rate (Admit) 68 bpm       Heart Rate (Exercise) 107 bpm       Heart Rate (Exit) 70 bpm       Oxygen Saturation (Admit) 99 %       Oxygen Saturation (Exercise) 100 %       Rating of Perceived Exertion (Exercise) 10       Symptoms none       Comments walk test results                Exercise Prescription Changes     Row Name 11/08/22 1400             Response to Exercise   Blood Pressure (Admit) 118/56       Blood Pressure (Exercise) 128/64       Blood Pressure (Exit) 120/64       Heart Rate (Admit) 68 bpm       Heart Rate (Exercise) 107 bpm       Heart Rate (Exit) 70 bpm       Oxygen Saturation (Admit) 99 %       Oxygen Saturation (Exercise) 100 %       Rating of Perceived Exertion (Exercise) 10       Symptoms none       Comments walk test results                Exercise Comments:   Exercise Comments     Row Name 11/12/22 1557           Exercise Comments First full day of exercise!  Patient was oriented to gym and equipment including functions, settings, policies, and procedures.  Patient's individual exercise prescription and treatment plan were reviewed.   All starting workloads were established based on the results of the 6 minute walk test done at initial orientation visit.  The plan for exercise progression was also introduced and progression will be customized based on patient's performance and goals.                Exercise Comments  Row Name 11/12/22 1557           Exercise Comments First full day of exercise!  Patient was oriented to gym and equipment including functions, settings, policies, and procedures.  Patient's individual exercise prescription and treatment plan were reviewed.  All starting workloads were established based on the results of the 6 minute walk test done at initial orientation visit.  The plan for exercise progression was also introduced and progression will be customized based on patient's performance and goals.                Exercise Goals and Review:   Exercise Goals     Row Name 11/08/22 1440             Exercise Goals   Increase Physical Activity Yes       Intervention Provide advice, education, support and counseling about physical activity/exercise needs.;Develop an individualized exercise prescription for aerobic and resistive training based on initial evaluation findings, risk stratification, comorbidities and participant's personal goals.       Expected Outcomes Short Term: Attend rehab on a regular basis to increase amount of physical activity.;Long Term: Add in home exercise to make exercise part of routine and to increase amount of physical activity.;Long Term: Exercising regularly at least 3-5 days a week.       Increase Strength and Stamina Yes       Intervention Provide advice, education, support and counseling about physical activity/exercise needs.;Develop an individualized exercise prescription for aerobic and resistive training based on initial evaluation findings, risk stratification, comorbidities and participant's personal goals.       Expected Outcomes Short Term: Increase  workloads from initial exercise prescription for resistance, speed, and METs.;Short Term: Perform resistance training exercises routinely during rehab and add in resistance training at home;Long Term: Improve cardiorespiratory fitness, muscular endurance and strength as measured by increased METs and functional capacity ( )       Able to understand and use rate of perceived exertion (RPE) scale Yes       Intervention Provide education and explanation on how to use RPE scale       Expected Outcomes Short Term: Able to use RPE daily in rehab to express subjective intensity level;Long Term:  Able to use RPE to guide intensity level when exercising independently       Able to understand and use Dyspnea scale Yes       Intervention Provide education and explanation on how to use Dyspnea scale       Expected Outcomes Long Term: Able to use Dyspnea scale to guide intensity level when exercising independently;Short Term: Able to use Dyspnea scale daily in rehab to express subjective sense of shortness of breath during exertion       Knowledge and understanding of Target Heart Rate Range (THRR) Yes       Intervention Provide education and explanation of THRR including how the numbers were predicted and where they are located for reference       Expected Outcomes Short Term: Able to state/look up THRR;Long Term: Able to use THRR to govern intensity when exercising independently;Short Term: Able to use daily as guideline for intensity in rehab       Able to check pulse independently Yes       Intervention Provide education and demonstration on how to check pulse in carotid and radial arteries.;Review the importance of being able to check your own pulse for safety during independent exercise  Expected Outcomes Short Term: Able to explain why pulse checking is important during independent exercise;Long Term: Able to check pulse independently and accurately       Understanding of Exercise Prescription Yes        Intervention Provide education, explanation, and written materials on patient's individual exercise prescription       Expected Outcomes Short Term: Able to explain program exercise prescription;Long Term: Able to explain home exercise prescription to exercise independently                Exercise Goals     Row Name 11/08/22 1440             Exercise Goals   Increase Physical Activity Yes       Intervention Provide advice, education, support and counseling about physical activity/exercise needs.;Develop an individualized exercise prescription for aerobic and resistive training based on initial evaluation findings, risk stratification, comorbidities and participant's personal goals.       Expected Outcomes Short Term: Attend rehab on a regular basis to increase amount of physical activity.;Long Term: Add in home exercise to make exercise part of routine and to increase amount of physical activity.;Long Term: Exercising regularly at least 3-5 days a week.       Increase Strength and Stamina Yes       Intervention Provide advice, education, support and counseling about physical activity/exercise needs.;Develop an individualized exercise prescription for aerobic and resistive training based on initial evaluation findings, risk stratification, comorbidities and participant's personal goals.       Expected Outcomes Short Term: Increase workloads from initial exercise prescription for resistance, speed, and METs.;Short Term: Perform resistance training exercises routinely during rehab and add in resistance training at home;Long Term: Improve cardiorespiratory fitness, muscular endurance and strength as measured by increased METs and functional capacity ( )       Able to understand and use rate of perceived exertion (RPE) scale Yes       Intervention Provide education and explanation on how to use RPE scale       Expected Outcomes Short Term: Able to use RPE daily in rehab to express  subjective intensity level;Long Term:  Able to use RPE to guide intensity level when exercising independently       Able to understand and use Dyspnea scale Yes       Intervention Provide education and explanation on how to use Dyspnea scale       Expected Outcomes Long Term: Able to use Dyspnea scale to guide intensity level when exercising independently;Short Term: Able to use Dyspnea scale daily in rehab to express subjective sense of shortness of breath during exertion       Knowledge and understanding of Target Heart Rate Range (THRR) Yes       Intervention Provide education and explanation of THRR including how the numbers were predicted and where they are located for reference       Expected Outcomes Short Term: Able to state/look up THRR;Long Term: Able to use THRR to govern intensity when exercising independently;Short Term: Able to use daily as guideline for intensity in rehab       Able to check pulse independently Yes       Intervention Provide education and demonstration on how to check pulse in carotid and radial arteries.;Review the importance of being able to check your own pulse for safety during independent exercise       Expected Outcomes Short Term: Able to explain why pulse checking is  important during independent exercise;Long Term: Able to check pulse independently and accurately       Understanding of Exercise Prescription Yes       Intervention Provide education, explanation, and written materials on patient's individual exercise prescription       Expected Outcomes Short Term: Able to explain program exercise prescription;Long Term: Able to explain home exercise prescription to exercise independently                Exercise Goals Re-Evaluation :  Exercise Goals Re-Evaluation     Row Name 11/08/22 1442             Exercise Goal Re-Evaluation   Exercise Goals Review Able to understand and use Dyspnea scale;Able to understand and use rate of perceived exertion  (RPE) scale;Understanding of Exercise Prescription       Comments Reviewed RPE  and dyspnea scale,and program prescription with pt today.  Pt voiced understanding and was given a copy of goals to take home.       Expected Outcomes Short: Use RPE daily to regulate intensity.  Long: Follow program prescription                Exercise Goals Re-Evaluation     Row Name 11/08/22 1442             Exercise Goal Re-Evaluation   Exercise Goals Review Able to understand and use Dyspnea scale;Able to understand and use rate of perceived exertion (RPE) scale;Understanding of Exercise Prescription       Comments Reviewed RPE  and dyspnea scale,and program prescription with pt today.  Pt voiced understanding and was given a copy of goals to take home.       Expected Outcomes Short: Use RPE daily to regulate intensity.  Long: Follow program prescription                 Discharge Exercise Prescription (Final Exercise Prescription Changes):  Exercise Prescription Changes - 11/08/22 1400       Response to Exercise   Blood Pressure (Admit) 118/56    Blood Pressure (Exercise) 128/64    Blood Pressure (Exit) 120/64    Heart Rate (Admit) 68 bpm    Heart Rate (Exercise) 107 bpm    Heart Rate (Exit) 70 bpm    Oxygen Saturation (Admit) 99 %    Oxygen Saturation (Exercise) 100 %    Rating of Perceived Exertion (Exercise) 10    Symptoms none    Comments walk test results             Nutrition:  Target Goals: Understanding of nutrition guidelines, daily intake of sodium 1500mg , cholesterol 200mg , calories 30% from fat and 7% or less from saturated fats, daily to have 5 or more servings of fruits and vegetables.  Biometrics:  Pre Biometrics - 11/08/22 1443       Pre Biometrics   Height 5' 2.5" (1.588 m)    Weight 188 lb 8 oz (85.5 kg)    Waist Circumference 38.75 inches    Hip Circumference 45 inches    Waist to Hip Ratio 0.86 %    BMI (Calculated) 33.91    Grip Strength 12.8 kg     Single Leg Stand 2.1 seconds              Nutrition Therapy Plan and Nutrition Goals:  Nutrition Therapy & Goals - 11/08/22 1453       Nutrition Therapy   Diet Currently follow [place] Diet  Intervention Plan   Intervention Prescribe, educate and counsel regarding individualized specific dietary modifications aiming towards targeted core components such as weight, hypertension, lipid management, diabetes, heart failure and other comorbidities.;Nutrition handout(s) given to patient.    Expected Outcomes Short Term Goal: Understand basic principles of dietary content, such as calories, fat, sodium, cholesterol and nutrients.             Nutrition Assessments:  MEDIFICTS Score Key: ?70 Need to make dietary changes  40-70 Heart Healthy Diet ? 40 Therapeutic Level Cholesterol Diet  Flowsheet Row CARDIAC REHAB PHASE II ORIENTATION from 11/08/2022 in [facility] CARDIAC RE[facility]ABILITATION  Picture Your Plate Total Score on Admission 66      Picture Your Plate Scores: <81 Unhealthy dietary pattern with much room for improvement. 41-50 Dietary pattern unlikely to meet recommendations for good health and room for improvement. 51-60 More healthful dietary pattern, with some room for improvement.  >60 Healthy dietary pattern, although there may be some specific behaviors that could be improved.    Nutrition Goals Re-Evaluation:   Nutrition Goals Discharge (Final Nutrition Goals Re-Evaluation):   Psychosocial: Target Goals: Acknowledge presence or absence of significant depression and/or stress, maximize coping skills, provide positive support system. Participant is able to verbalize types and ability to use techniques and skills needed for reducing stress and depression.  Initial Review & Psychosocial Screening:  Initial Psych Review & Screening - 11/08/22 1307       Initial Review   Current issues with Current Stress Concerns    Source of Stress Concerns Chronic  Illness;Family;Unable to participate in former interests or hobbies;Unable to perform yard/household activities    Comments daughter in [place] (just lost husband), tries not to let things get to her, goes to senior center in [place], would like to get back to volunteering, good sleeper since stomach surgery, tends to forget about herself at times      [facility]   Good Support System? Yes   daughter, friends at [employer], pastor at [employer]   Psychosocial barriers to participate in program The patient should benefit from training in stress management and relaxation.;Psychosocial barriers identified (see note)      Screening Interventions   Interventions Encouraged to exercise;Provide feedback about the scores to participant;To provide support and resources with identified psychosocial needs    Expected Outcomes Short Term goal: Utilizing psychosocial counselor, staff and physician to assist with identification of specific Stressors or current issues interfering with healing process. Setting desired goal for each stressor or current issue identified.;Long Term Goal: Stressors or current issues are controlled or eliminated.;Short Term goal: Identification and review with participant of any Quality of Life or Depression concerns found by scoring the questionnaire.;Long Term goal: The participant improves quality of Life and PHQ9 Scores as seen by post scores and/or verbalization of changes             Quality of Life Scores:  Quality of Life - 11/08/22 1453       Quality of Life   Select Quality of Life      Quality of Life Scores   Health/Function Pre 28 %    Socioeconomic Pre 30 %    Psych/Spiritual Pre 30 %    Family Pre 30 %    GLOBAL Pre 29.12 %            Scores of 19 and below usually indicate a poorer quality of life in these areas.  A difference  of  2-3 points is a clinically meaningful difference.  A difference of 2-3 points in the total score of the Quality  of Life Index has been associated with significant improvement in overall quality of life, self-image, physical symptoms, and general health in studies assessing change in quality of life.  PHQ-9: Review Flowsheet       11/08/2022  Depression screen PHQ 2/9  Decreased Interest 1  Down, Depressed, Hopeless 0  PHQ - 2 Score 1  Altered sleeping 2  Tired, decreased energy 1  Change in appetite 0  Feeling bad or failure about yourself  0  Trouble concentrating 0  Moving slowly or fidgety/restless 0  Suicidal thoughts 0  PHQ-9 Score 4  Difficult doing work/chores Not difficult at all    Details           Interpretation of Total Score  Total Score Depression Severity:  1-4 = Minimal depression, 5-9 = Mild depression, 10-14 = Moderate depression, 15-19 = Moderately severe depression, 20-27 = Severe depression   Psychosocial Evaluation and Intervention:  Psychosocial Evaluation - 11/08/22 1444       Psychosocial Evaluation & Interventions   Interventions Encouraged to exercise with the program and follow exercise prescription    Comments Randal is coming to cardiac rehab after a stent and PTCA.  She is super excited to start rehab and feels that she is going to get a lot out of it.  She has recently lost a lot of weight through diet and exercise on her own.  She wants to get stronger and build her stamina back up.  She also wants to boost her confidence in her heart as well.  She is a very independently woman and was discouraged when she had to go live with her daughter for a while after surgery.  She missed her independence and is eager to get it all back.  While living with her daughter in [place], her daughter's husband passed, so they were able to be together and lift each other back up.  Erna is very acitve in the [employer] and at the [employer].  She has friends everywhere that look in on her.  She goes to exercise classes at [facility]. She is planning to use transportation to  get to rehab and will get that set up today.  Otherwise, she has no barriers to attending rehab.  She is a happy person and sleeps well most nights.  She has a history of some anxiety but currently doing well.    Expected Outcomes Short: Attend rehab to boost stamina Long; conitnue to exercise for mental boost    Continue Psychosocial Services  Follow up required by staff             Psychosocial Re-Evaluation:   Psychosocial Discharge (Final Psychosocial Re-Evaluation):   Vocational Rehabilitation: Provide vocational rehab assistance to qualifying candidates.   Vocational Rehab Evaluation & Intervention:  Vocational Rehab - 11/08/22 1306       Initial Vocational Rehab Evaluation & Intervention   Assessment shows need for Vocational Rehabilitation No   retired            Education: Education Goals: Education classes will be provided on a weekly basis, covering required topics. Participant will state understanding/return demonstration of topics presented.  Learning Barriers/Preferences:  Learning Barriers/Preferences - 11/08/22 1305       Learning Barriers/Preferences   Learning Barriers Sight   glasses   Learning Preferences Skilled Demonstration  Education Topics: Hypertension, Hypertension Reduction -Define heart disease and high blood pressure. Discus how high blood pressure affects the body and ways to reduce high blood pressure.   Exercise and Your Heart -Discuss why it is important to exercise, the FITT principles of exercise, normal and abnormal responses to exercise, and how to exercise safely.   Angina -Discuss definition of angina, causes of angina, treatment of angina, and how to decrease risk of having angina.   Cardiac Medications -Review what the following cardiac medications are used for, how they affect the body, and side effects that may occur when taking the medications.  Medications include Aspirin, Beta blockers, calcium  channel blockers, ACE Inhibitors, angiotensin receptor blockers, diuretics, digoxin, and antihyperlipidemics.   Congestive Heart Failure -Discuss the definition of CHF, how to live with CHF, the signs and symptoms of CHF, and how keep track of weight and sodium intake.   Heart Disease and Intimacy -Discus the effect sexual activity has on the heart, how changes occur during intimacy as we age, and safety during sexual activity. Flowsheet Row CARDIAC REHAB PHASE II EXERCISE from 11/14/2022 in [place] CARDIAC REHABILITATION  Date 11/14/22  Educator [facility]  Instruction Review Code 1- Verbalizes Understanding       Smoking Cessation / COPD -Discuss different methods to quit smoking, the health benefits of quitting smoking, and the definition of COPD.   Nutrition I: Fats -Discuss the types of cholesterol, what cholesterol does to the heart, and how cholesterol levels can be controlled.   Nutrition II: Labels -Discuss the different components of food labels and how to read food label   Heart Parts/Heart Disease and PAD -Discuss the anatomy of the heart, the pathway of blood circulation through the heart, and these are affected by heart disease.   Stress I: Signs and Symptoms -Discuss the causes of stress, how stress may lead to anxiety and depression, and ways to limit stress.   Stress II: Relaxation -Discuss different types of relaxation techniques to limit stress.   Warning Signs of Stroke / TIA -Discuss definition of a stroke, what the signs and symptoms are of a stroke, and how to identify when someone is having stroke.   Knowledge Questionnaire Score:  Knowledge Questionnaire Score - 11/08/22 1454       Knowledge Questionnaire Score   Pre Score 18/24             Core Components/Risk Factors/Patient Goals at Admission:  Personal Goals and Risk Factors at Admission - 11/08/22 1454       Core Components/Risk Factors/Patient Goals on Admission    Weight  Management Yes;Obesity;Weight Loss    Intervention Weight Management: Develop a combined nutrition and exercise program designed to reach desired caloric intake, while maintaining appropriate intake of nutrient and fiber, sodium and fats, and appropriate energy expenditure required for the weight goal.;Weight Management: Provide education and appropriate resources to help participant work on and attain dietary goals.;Weight Management/Obesity: Establish reasonable short term and long term weight goals.;Obesity: Provide education and appropriate resources to help participant work on and attain dietary goals.    Admit Weight 188 lb 8 oz (85.5 kg)    Goal Weight: Short Term 183 lb (83 kg)    Goal Weight: Long Term 180 lb (81.6 kg)    Expected Outcomes Short Term: Continue to assess and modify interventions until short term weight is achieved;Long Term: Adherence to nutrition and physical activity/exercise program aimed toward attainment of established weight goal;Weight Loss: Understanding of general recommendations  for a balanced deficit meal plan, which promotes 1-2 lb weight loss per week and includes a negative energy balance of [phone] kcal/d;Understanding recommendations for meals to include 15-35% energy as protein, 25-35% energy from fat, 35-60% energy from carbohydrates, less than 200mg  of dietary cholesterol, 20-35 gm of total fiber daily;Understanding of distribution of calorie intake throughout the day with the consumption of 4-5 meals/snacks    Diabetes Yes    Intervention Provide education about signs/symptoms and action to take for hypo/hyperglycemia.;Provide education about proper nutrition, including hydration, and aerobic/resistive exercise prescription along with prescribed medications to achieve blood glucose in normal ranges: Fasting glucose 65-99 mg/dL    Expected Outcomes Short Term: Participant verbalizes understanding of the signs/symptoms and immediate care of hyper/hypoglycemia,  proper foot care and importance of medication, aerobic/resistive exercise and nutrition plan for blood glucose control.;Long Term: Attainment of HbA1C < 7%.    Hypertension Yes    Intervention Provide education on lifestyle modifcations including regular physical activity/exercise, weight management, moderate sodium restriction and increased consumption of fresh fruit, vegetables, and low fat dairy, alcohol moderation, and smoking cessation.;Monitor prescription use compliance.    Expected Outcomes Short Term: Continued assessment and intervention until BP is < 140/15mm HG in hypertensive participants. < 130/31mm HG in hypertensive participants with diabetes, heart failure or chronic kidney disease.;Long Term: Maintenance of blood pressure at goal levels.    Lipids Yes    Intervention Provide education and support for participant on nutrition & aerobic/resistive exercise along with prescribed medications to achieve LDL 70mg , HDL >40mg .    Expected Outcomes Short Term: Participant states understanding of desired cholesterol values and is compliant with medications prescribed. Participant is following exercise prescription and nutrition guidelines.;Long Term: Cholesterol controlled with medications as prescribed, with individualized exercise RX and with personalized nutrition plan. Value goals: LDL < 70mg , HDL > 40 mg.             Core Components/Risk Factors/Patient Goals Review:    Core Components/Risk Factors/Patient Goals at Discharge (Final Review):    ITP Comments:  ITP Comments     Row Name 11/08/22 1428 11/12/22 1557 11/21/22 0938       ITP Comments Patient attend orientation today.  Patient is attendingCardiac Rehabilitation Program.  Documentation for diagnosis can be found in [facility] 08/10/22.  Reviewed medical chart, RPE/RPD, gym safety, and program guidelines.  Patient was fitted to equipment they will be using during rehab.  Patient is scheduled to start exercise on 11/12/22 at 1445.    Initial ITP created and sent for review and signature by Dr. [name], Medical Director for Cardiac Rehabilitation Program. First full day of exercise!  Patient was oriented to gym and equipment including functions, settings, policies, and procedures.  Patient's individual exercise prescription and treatment plan were reviewed.  All starting workloads were established based on the results of the 6 minute walk test done at initial orientation visit.  The plan for exercise progression was also introduced and progression will be customized based on patient's performance and goals. 30 day review completed. ITP sent to Dr. [name], Medical Director of Cardiac Rehab. Continue with ITP unless changes are made by physician. new to program              ITP Comments     Row Name 11/08/22 1428 11/12/22 1557 11/21/22 0938       ITP Comments Patient attend orientation today.  Patient is attendingCardiac Rehabilitation Program.  Documentation for diagnosis can be found in CE  08/10/22.  Reviewed medical chart, RPE/RPD, gym safety, and program guidelines.  Patient was fitted to equipment they will be using during rehab.  Patient is scheduled to start exercise on 11/12/22 at 1445.   Initial ITP created and sent for review and signature by Dr. [name], Medical Director for Cardiac Rehabilitation Program. First full day of exercise!  Patient was oriented to gym and equipment including functions, settings, policies, and procedures.  Patient's individual exercise prescription and treatment plan were reviewed.  All starting workloads were established based on the results of the 6 minute walk test done at initial orientation visit.  The plan for exercise progression was also introduced and progression will be customized based on patient's performance and goals. 30 day review completed. ITP sent to Dr. [name], Medical Director of Cardiac Rehab. Continue with ITP unless changes are made by physician.  new to program              Comments: 30 day review

## 2022-11-23 ENCOUNTER — Encounter (HOSPITAL_COMMUNITY): Payer: Medicare HMO

## 2022-11-23 ENCOUNTER — Encounter (HOSPITAL_COMMUNITY)
Admission: RE | Admit: 2022-11-23 | Discharge: 2022-11-23 | Disposition: A | Discharge status: Home / Self Care | Payer: Medicare HMO | Source: Ambulatory Visit | Attending: Internal Medicine | Admitting: Internal Medicine

## 2022-11-23 DIAGNOSIS — Z9861 Coronary angioplasty status: Secondary | ICD-10-CM

## 2022-11-23 DIAGNOSIS — Z955 Presence of coronary angioplasty implant and graft: Secondary | ICD-10-CM | POA: Diagnosis not present

## 2022-11-23 LAB — GLUCOSE, CAPILLARY
Glucose-Capillary: 159 mg/dL — ABNORMAL HIGH (ref 70–99)
Glucose-Capillary: 156 mg/dL — ABNORMAL HIGH (ref 70–99)

## 2022-11-23 NOTE — Progress Notes (Signed)
Daily Session Note  Patient Details  Name: Vanessa [name] MRN: [id number] Date of Birth: 06-23-1950 Referring Provider:   Flowsheet Row CARDIAC REHAB PHASE II ORIENTATION from 11/08/2022 in [facility] CARDIAC REHABILITATION  Referring Provider Cyndia Bent MD  [name], PA]       Encounter Date: 11/23/2022  Check In:  Session Check In - 11/23/22 1420       Check-In   Supervising physician immediately available to respond to emergencies See telemetry face sheet for immediately available ER MD    Location [facility]    Staff Present Ross Ludwig, BS, Exercise Physiologist;Phyllis Billingsley, Margarite Gouge, RN, Pleas Koch, RN, BSN    Virtual Visit No    Medication changes reported     No    Fall or balance concerns reported    No    Warm-up and Cool-down Performed on first and last piece of equipment    Resistance Training Performed Yes    VAD Patient? No    PAD/SET Patient? No      Pain Assessment   Currently in Pain? No/denies    Multiple Pain Sites No             Capillary Blood Glucose: Results for orders placed or performed during the hospital encounter of 11/23/22 (from the past 24 hour(s))  Glucose, capillary     Status: Abnormal   Collection Time: 11/23/22  2:23 PM  Result Value Ref Range   Glucose-Capillary 159 (H) 70 - 99 mg/dL      Social History   Tobacco Use  Smoking Status Never  Smokeless Tobacco Never    Goals Met:  Independence with exercise equipment Exercise tolerated well No report of concerns or symptoms today Strength training completed today  Goals Unmet:  Not Applicable  Comments: Pt able to follow exercise prescription today without complaint.  Will continue to monitor for progression.    Dr. [name] is Medical Director for [facility] Cardiac Rehab

## 2022-11-26 ENCOUNTER — Encounter (HOSPITAL_COMMUNITY)
Admission: RE | Admit: 2022-11-26 | Discharge: 2022-11-26 | Disposition: A | Discharge status: Home / Self Care | Payer: Medicare HMO | Source: Ambulatory Visit | Attending: Internal Medicine | Admitting: Internal Medicine

## 2022-11-26 ENCOUNTER — Encounter (HOSPITAL_COMMUNITY): Payer: Medicare HMO

## 2022-11-26 DIAGNOSIS — Z9861 Coronary angioplasty status: Secondary | ICD-10-CM

## 2022-11-26 DIAGNOSIS — Z955 Presence of coronary angioplasty implant and graft: Secondary | ICD-10-CM | POA: Diagnosis not present

## 2022-11-26 NOTE — Progress Notes (Signed)
Daily Session Note  Patient Details  Name: Vanessa [name] MRN: [id number] Date of Birth: 02/09/1951 Referring Provider:   Flowsheet Row CARDIAC REHAB PHASE II ORIENTATION from 11/08/2022 in [facility] CARDIAC REHABILITATION  Referring Provider Vanessa Bent MD  Vanessa [name] Ward, PA]       Encounter Date: 11/26/2022  Check In:  Session Check In - 11/26/22 1420       Check-In   Supervising physician immediately available to respond to emergencies See telemetry face sheet for immediately available MD    Physician(s) Dr. Wyline [name]    Location [facility]    Staff Present Vanessa Speed, RN;Vanessa Daphine Deutscher, RN, BSN;Vanessa Rodeo, MA, RCEP, CCRP, Vanessa Adolph, RN, BSN    Virtual Visit No    Medication changes reported     No    Fall or balance concerns reported    No    Tobacco Cessation No Change    Warm-up and Cool-down Performed on first and last piece of equipment    Resistance Training Performed Yes    VAD Patient? No    PAD/SET Patient? No      Pain Assessment   Currently in Pain? No/denies    Multiple Pain Sites No             Capillary Blood Glucose: No results found for this or any previous visit (from the past 24 hour(s)).    Social History   Tobacco Use  Smoking Status Never  Smokeless Tobacco Never    Goals Met:  Proper associated with RPD/PD & O2 Sat Independence with exercise equipment Exercise tolerated well No report of concerns or symptoms today Strength training completed today  Goals Unmet:  Not Applicable  Comments: Pt able to follow exercise prescription today without complaint.  Will continue to monitor for progression.    Dr. Dina [name] is Medical Director for [facility] Cardiac Rehab

## 2022-11-28 ENCOUNTER — Encounter (HOSPITAL_COMMUNITY)
Admission: RE | Admit: 2022-11-28 | Discharge: 2022-11-28 | Disposition: A | Discharge status: Home / Self Care | Payer: Medicare HMO | Source: Ambulatory Visit | Attending: Internal Medicine | Admitting: Internal Medicine

## 2022-11-28 ENCOUNTER — Encounter (HOSPITAL_COMMUNITY): Payer: Medicare HMO

## 2022-11-28 DIAGNOSIS — Z955 Presence of coronary angioplasty implant and graft: Secondary | ICD-10-CM | POA: Diagnosis not present

## 2022-11-28 DIAGNOSIS — Z9861 Coronary angioplasty status: Secondary | ICD-10-CM

## 2022-11-28 NOTE — Progress Notes (Signed)
Daily Session Note  Patient Details  Name: Vanessa [name] MRN: [id number] Date of Birth: 1950/07/29 Referring Provider:   Flowsheet Row CARDIAC REHAB PHASE II ORIENTATION from 11/08/2022 in [facility] CARDIAC REHABILITATION  Referring Provider Cyndia Bent MD  [name], PA]       Encounter Date: 11/28/2022  Check In:  Session Check In - 11/28/22 1400       Check-In   Supervising physician immediately available to respond to emergencies See telemetry face sheet for immediately available ER MD    Location [facility]    Staff Present Rodena Medin, RN, BSN;Hillary Troutman BSN, RN;Heather Fredric Mare, BS, Exercise Physiologist    Virtual Visit No    Medication changes reported     No    Fall or balance concerns reported    No    Warm-up and Cool-down Performed on first and last piece of equipment    Resistance Training Performed Yes    VAD Patient? No    PAD/SET Patient? No      Pain Assessment   Currently in Pain? No/denies    Multiple Pain Sites No             Capillary Blood Glucose: No results found for this or any previous visit (from the past 24 hour(s)).    Social History   Tobacco Use  Smoking Status Never  Smokeless Tobacco Never    Goals Met:  Independence with exercise equipment Exercise tolerated well No report of concerns or symptoms today Strength training completed today  Goals Unmet:  Not Applicable  Comments: Pt able to follow exercise prescription today without complaint.  Will continue to monitor for progression.    Dr. [name] is Medical Director for [facility] Cardiac Rehab

## 2022-11-30 ENCOUNTER — Encounter (HOSPITAL_COMMUNITY): Payer: Medicare HMO

## 2022-11-30 ENCOUNTER — Encounter (HOSPITAL_COMMUNITY): Admission: RE | Admit: 2022-11-30 | Payer: Medicare HMO | Source: Ambulatory Visit

## 2022-11-30 DIAGNOSIS — Z955 Presence of coronary angioplasty implant and graft: Secondary | ICD-10-CM

## 2022-11-30 DIAGNOSIS — Z9861 Coronary angioplasty status: Secondary | ICD-10-CM

## 2022-11-30 NOTE — Progress Notes (Signed)
Daily Session Note  Patient Details  Name: Vanessa [name] MRN: [id number] Date of Birth: 1950-05-14 Referring Provider:   Flowsheet Row CARDIAC REHAB PHASE II ORIENTATION from 11/08/2022 in [facility] CARDIAC REHABILITATION  Referring Provider Cyndia Bent MD  [name], PA]       Encounter Date: 11/30/2022  Check In:  Session Check In - 11/30/22 1415       Check-In   Supervising physician immediately available to respond to emergencies See telemetry face sheet for immediately available ER MD    Location [facility]    Staff Present Ross Ludwig, BS, Exercise Physiologist;Johann Gascoigne Laural Benes, RN, BSN;Phyllis Billingsley, RN    Virtual Visit No    Medication changes reported     No    Fall or balance concerns reported    No    Warm-up and Cool-down Performed on first and last piece of equipment    Resistance Training Performed Yes    VAD Patient? No    PAD/SET Patient? No      Pain Assessment   Currently in Pain? No/denies    Multiple Pain Sites No             Capillary Blood Glucose: No results found for this or any previous visit (from the past 24 hour(s)).    Social History   Tobacco Use  Smoking Status Never  Smokeless Tobacco Never    Goals Met:  Independence with exercise equipment Exercise tolerated well No report of concerns or symptoms today Strength training completed today  Goals Unmet:  Not Applicable  Comments: Pt able to follow exercise prescription today without complaint.  Will continue to monitor for progression.    Dr. [name] is Medical Director for [facility] Cardiac Rehab

## 2022-12-03 ENCOUNTER — Encounter (HOSPITAL_COMMUNITY): Payer: Medicare HMO

## 2022-12-03 ENCOUNTER — Encounter (HOSPITAL_COMMUNITY)
Admission: RE | Admit: 2022-12-03 | Discharge: 2022-12-03 | Disposition: A | Discharge status: Home / Self Care | Payer: Medicare HMO | Source: Ambulatory Visit | Attending: Internal Medicine | Admitting: Internal Medicine

## 2022-12-03 DIAGNOSIS — Z955 Presence of coronary angioplasty implant and graft: Secondary | ICD-10-CM

## 2022-12-03 DIAGNOSIS — Z9861 Coronary angioplasty status: Secondary | ICD-10-CM

## 2022-12-03 NOTE — Progress Notes (Signed)
Daily Session Note  Patient Details  Name: SUSEJ SPECKER MRN: [id number] Date of Birth: 10/20/1950 Referring Provider:   Flowsheet Row CARDIAC REHAB PHASE II ORIENTATION from 11/08/2022 in [facility] CARDIAC REHABILITATION  Referring Provider Cyndia Bent MD  [name], PA]       Encounter Date: 12/03/2022  Check In:  Session Check In - 12/03/22 1430       Check-In   Supervising physician immediately available to respond to emergencies See telemetry face sheet for immediately available MD    Location [facility]    Staff Present Ross Ludwig, BS, Exercise Physiologist;Jessica Juanetta Gosling, MA, RCEP, CCRP, Harolyn Rutherford, RN, BSN    Virtual Visit No    Medication changes reported     No    Fall or balance concerns reported    No    Tobacco Cessation No Change    Warm-up and Cool-down Performed on first and last piece of equipment    Resistance Training Performed Yes    VAD Patient? No      Pain Assessment   Currently in Pain? No/denies             Capillary Blood Glucose: No results found for this or any previous visit (from the past 24 hour(s)).    Social History   Tobacco Use  Smoking Status Never  Smokeless Tobacco Never    Goals Met:  Independence with exercise equipment Exercise tolerated well No report of concerns or symptoms today Strength training completed today  Goals Unmet:  Not Applicable  Comments: Pt able to follow exercise prescription today without complaint.  Will continue to monitor for progression.    Dr. [name] is Medical Director for [facility] Cardiac Rehab

## 2022-12-05 ENCOUNTER — Encounter (HOSPITAL_COMMUNITY)
Admission: RE | Admit: 2022-12-05 | Discharge: 2022-12-05 | Disposition: A | Discharge status: Home / Self Care | Payer: Medicare HMO | Source: Ambulatory Visit | Attending: Internal Medicine | Admitting: Internal Medicine

## 2022-12-05 ENCOUNTER — Encounter (HOSPITAL_COMMUNITY): Payer: Medicare HMO

## 2022-12-05 DIAGNOSIS — Z9861 Coronary angioplasty status: Secondary | ICD-10-CM

## 2022-12-05 DIAGNOSIS — Z955 Presence of coronary angioplasty implant and graft: Secondary | ICD-10-CM | POA: Diagnosis not present

## 2022-12-05 NOTE — Progress Notes (Signed)
Daily Session Note  Patient Details  Name: Vanessa [name] MRN: [id number] Date of Birth: 03-01-1951 Referring Provider:   Flowsheet Row CARDIAC REHAB PHASE II ORIENTATION from 11/08/2022 in [facility] CARDIAC REHABILITATION  Referring Provider Cyndia Bent MD  [name], PA]       Encounter Date: 12/05/2022  Check In:  Session Check In - 12/05/22 1424       Check-In   Supervising physician immediately available to respond to emergencies See telemetry face sheet for immediately available MD    Location [facility]    Staff Present Ross Ludwig, BS, Exercise Physiologist;Debra Laural Benes, RN, Pleas Koch, RN, BSN    Virtual Visit No    Medication changes reported     No    Fall or balance concerns reported    No    Tobacco Cessation No Change    Warm-up and Cool-down Performed on first and last piece of equipment    Resistance Training Performed Yes    VAD Patient? No    PAD/SET Patient? No      Pain Assessment   Currently in Pain? No/denies    Multiple Pain Sites No             Capillary Blood Glucose: No results found for this or any previous visit (from the past 24 hour(s)).    Social History   Tobacco Use  Smoking Status Never  Smokeless Tobacco Never    Goals Met:  Independence with exercise equipment Exercise tolerated well No report of concerns or symptoms today Strength training completed today  Goals Unmet:  Not Applicable  Comments: Pt able to follow exercise prescription today without complaint.  Will continue to monitor for progression.    Dr. [name] is Medical Director for [facility] Cardiac Rehab

## 2022-12-07 ENCOUNTER — Encounter (HOSPITAL_COMMUNITY)
Admission: RE | Admit: 2022-12-07 | Discharge: 2022-12-07 | Disposition: A | Discharge status: Home / Self Care | Payer: Medicare HMO | Source: Ambulatory Visit | Attending: Internal Medicine | Admitting: Internal Medicine

## 2022-12-07 ENCOUNTER — Encounter (HOSPITAL_COMMUNITY): Payer: Medicare HMO

## 2022-12-07 DIAGNOSIS — Z9861 Coronary angioplasty status: Secondary | ICD-10-CM

## 2022-12-07 DIAGNOSIS — Z955 Presence of coronary angioplasty implant and graft: Secondary | ICD-10-CM | POA: Diagnosis not present

## 2022-12-07 NOTE — Progress Notes (Signed)
Daily Session Note  Patient Details  Name: Vanessa [name] MRN: [id number] Date of Birth: 1950/12/19 Referring Provider:   Flowsheet Row CARDIAC REHAB PHASE II ORIENTATION from 11/08/2022 in [facility] CARDIAC REHABILITATION  Referring Provider Cyndia Bent MD  [name], PA]       Encounter Date: 12/07/2022  Check In:  Session Check In - 12/07/22 1430       Check-In   Supervising physician immediately available to respond to emergencies See telemetry face sheet for immediately available MD    Location [facility]-Cardiac [facility] Rehab    Staff Present Ross Ludwig, BS, Exercise Physiologist;Daphyne Daphine Deutscher, RN, BSN    Staff Present Kary Kos, MS, Exercise Physiologist    Virtual Visit No    Medication changes reported     No    Fall or balance concerns reported    No    Tobacco Cessation No Change    Warm-up and Cool-down Performed on first and last piece of equipment    Resistance Training Performed Yes    VAD Patient? No    PAD/SET Patient? No      Pain Assessment   Currently in Pain? No/denies    Multiple Pain Sites No             Capillary Blood Glucose: No results found for this or any previous visit (from the past 24 hour(s)).    Social History   Tobacco Use  Smoking Status Never  Smokeless Tobacco Never    Goals Met:  Independence with exercise equipment Exercise tolerated well No report of concerns or symptoms today Strength training completed today  Goals Unmet:  Not Applicable  Comments: Pt able to follow exercise prescription today without complaint.  Will continue to monitor for progression.    Dr. [name] is Medical Director for [facility] Cardiac Rehab

## 2022-12-10 ENCOUNTER — Encounter (HOSPITAL_COMMUNITY): Payer: Medicare HMO

## 2022-12-12 ENCOUNTER — Encounter (HOSPITAL_COMMUNITY)
Admission: RE | Admit: 2022-12-12 | Discharge: 2022-12-12 | Disposition: A | Discharge status: Home / Self Care | Payer: Medicare HMO | Source: Ambulatory Visit | Attending: Internal Medicine | Admitting: Internal Medicine

## 2022-12-12 ENCOUNTER — Encounter (HOSPITAL_COMMUNITY): Payer: Medicare HMO

## 2022-12-12 DIAGNOSIS — Z9861 Coronary angioplasty status: Secondary | ICD-10-CM | POA: Insufficient documentation

## 2022-12-12 DIAGNOSIS — Z955 Presence of coronary angioplasty implant and graft: Secondary | ICD-10-CM | POA: Insufficient documentation

## 2022-12-12 NOTE — Progress Notes (Signed)
Daily Session Note  Patient Details  Name: Vanessa [name] MRN: [id number] Date of Birth: 1950/05/10 Referring Provider:   Flowsheet Row CARDIAC REHAB PHASE II ORIENTATION from 11/08/2022 in [facility] CARDIAC REHABILITATION  Referring Provider Cyndia Bent MD  [name], PA]       Encounter Date: 12/12/2022  Check In:  Session Check In - 12/12/22 1400       Check-In   Supervising physician immediately available to respond to emergencies [facility] MD immediately available    Physician(s) Dr. Wyline Mood    Location [facility]    Staff Present Ross Ludwig, BS, Exercise Physiologist;Mikeisha Lemonds BSN, RN;Jessica [place], MA, RCEP, CCRP, CCET    Virtual Visit No    Medication changes reported     No    Fall or balance concerns reported    No    Tobacco Cessation No Change    Warm-up and Cool-down Performed on first and last piece of equipment    Resistance Training Performed Yes    VAD Patient? No    PAD/SET Patient? No      Pain Assessment   Currently in Pain? No/denies    Multiple Pain Sites No             Capillary Blood Glucose: No results found for this or any previous visit (from the past 24 hour(s)).    Social History   Tobacco Use  Smoking Status Never  Smokeless Tobacco Never    Goals Met:  Independence with exercise equipment Exercise tolerated well No report of concerns or symptoms today Strength training completed today  Goals Unmet:  Not Applicable  Comments: Marland KitchenMarland KitchenPt able to follow exercise prescription today without complaint.  Will continue to monitor for progression.    Dr. [name] is Medical Director for [facility] Cardiac Rehab

## 2022-12-14 ENCOUNTER — Encounter (HOSPITAL_COMMUNITY)
Admission: RE | Admit: 2022-12-14 | Discharge: 2022-12-14 | Disposition: A | Discharge status: Home / Self Care | Payer: Medicare HMO | Source: Ambulatory Visit | Attending: Internal Medicine | Admitting: Internal Medicine

## 2022-12-14 ENCOUNTER — Encounter (HOSPITAL_COMMUNITY): Payer: Medicare HMO

## 2022-12-14 DIAGNOSIS — Z9861 Coronary angioplasty status: Secondary | ICD-10-CM

## 2022-12-14 DIAGNOSIS — Z955 Presence of coronary angioplasty implant and graft: Secondary | ICD-10-CM

## 2022-12-14 NOTE — Progress Notes (Signed)
Daily Session Note  Patient Details  Name: Vanessa [name] MRN: [id number] Date of Birth: 15-Jun-1950 Referring Provider:   Flowsheet Row CARDIAC REHAB PHASE II ORIENTATION from 11/08/2022 in [facility] CARDIAC REHABILITATION  Referring Provider Cyndia Bent MD  [name], PA]       Encounter Date: 12/14/2022  Check In:  Session Check In - 12/14/22 1430       Check-In   Supervising physician immediately available to respond to emergencies [facility] MD immediately available    Physician(s) Dr. Wyline Mood    Location [facility]    Staff Present Erskine Speed, RN;Debra Laural Benes, RN, Pleas Koch, RN, BSN    Virtual Visit No    Medication changes reported     No    Fall or balance concerns reported    No    Tobacco Cessation No Change    Warm-up and Cool-down Performed on first and last piece of equipment    Resistance Training Performed Yes    VAD Patient? No    PAD/SET Patient? No      Pain Assessment   Currently in Pain? No/denies    Multiple Pain Sites No             Capillary Blood Glucose: No results found for this or any previous visit (from the past 24 hour(s)).    Social History   Tobacco Use  Smoking Status Never  Smokeless Tobacco Never    Goals Met:  Independence with exercise equipment Exercise tolerated well No report of concerns or symptoms today  Goals Unmet:  Not Applicable  Comments: Pt able to follow exercise prescription today without complaint.  Will continue to monitor for progression.    Dr. [name] is Medical Director for [facility] Cardiac Rehab

## 2022-12-17 ENCOUNTER — Encounter (HOSPITAL_COMMUNITY): Payer: Medicare HMO

## 2022-12-17 ENCOUNTER — Encounter (HOSPITAL_COMMUNITY)
Admission: RE | Admit: 2022-12-17 | Discharge: 2022-12-17 | Disposition: A | Discharge status: Home / Self Care | Payer: Medicare HMO | Source: Ambulatory Visit | Attending: Internal Medicine | Admitting: Internal Medicine

## 2022-12-17 DIAGNOSIS — Z955 Presence of coronary angioplasty implant and graft: Secondary | ICD-10-CM | POA: Diagnosis not present

## 2022-12-17 DIAGNOSIS — Z9861 Coronary angioplasty status: Secondary | ICD-10-CM

## 2022-12-17 NOTE — Progress Notes (Signed)
Daily Session Note  Patient Details  Name: Vanessa [name] MRN: [id number] Date of Birth: Aug 28, 1950 Referring Provider:   Flowsheet Row CARDIAC REHAB PHASE II ORIENTATION from 11/08/2022 in [facility]  Referring Provider Cyndia Bent MD  [name], PA]       Encounter Date: 12/17/2022  Check In:  Session Check In - 12/17/22 1439       Check-In   Supervising physician immediately available to respond to emergencies See telemetry face sheet for immediately available MD    Staff Present Fabio Pierce, MA, RCEP, CCRP, CCET;Phyllis Billingsley, RN;Heather Fredric Mare, [place], Exercise Physiologist    Virtual Visit No    Medication changes reported     No    Fall or balance concerns reported    No    Warm-up and Cool-down Performed on first and last piece of equipment    Resistance Training Performed Yes    VAD Patient? No    PAD/SET Patient? No      Pain Assessment   Currently in Pain? No/denies             Capillary Blood Glucose: No results found for this or any previous visit (from the past 24 hour(s)).    Social History   Tobacco Use  Smoking Status Never  Smokeless Tobacco Never    Goals Met:  Independence with exercise equipment Exercise tolerated well No report of concerns or symptoms today Strength training completed today  Goals Unmet:  Not Applicable  Comments: Pt able to follow exercise prescription today without complaint.  Will continue to monitor for progression.    Dr. [name] is Medical Director for [facility]

## 2022-12-19 ENCOUNTER — Encounter (HOSPITAL_COMMUNITY): Payer: Medicare HMO

## 2022-12-19 ENCOUNTER — Encounter (HOSPITAL_COMMUNITY): Payer: Self-pay | Admitting: *Deleted

## 2022-12-19 ENCOUNTER — Encounter (HOSPITAL_COMMUNITY)
Admission: RE | Admit: 2022-12-19 | Discharge: 2022-12-19 | Disposition: A | Discharge status: Home / Self Care | Payer: Medicare HMO | Source: Ambulatory Visit | Attending: Internal Medicine | Admitting: Internal Medicine

## 2022-12-19 DIAGNOSIS — Z9861 Coronary angioplasty status: Secondary | ICD-10-CM

## 2022-12-19 DIAGNOSIS — Z955 Presence of coronary angioplasty implant and graft: Secondary | ICD-10-CM | POA: Diagnosis not present

## 2022-12-19 LAB — AMB RESULTS CONSOLE CBG: Glucose: 282

## 2022-12-19 LAB — HEMOGLOBIN A1C: Hemoglobin A1C: 7.7

## 2022-12-19 NOTE — Progress Notes (Signed)
Cardiac Individual Treatment Plan  Patient Details  Name: Vanessa [name] MRN: [id number] Date of Birth: 1950/10/07 Referring Provider:   Flowsheet Row CARDIAC REHAB PHASE II ORIENTATION from 11/08/2022 in [facility] CARDIAC REHABILITATION  Referring Provider Cyndia Bent MD  [name], [facility]]       Initial Encounter Date:  Flowsheet Row CARDIAC REHAB PHASE II ORIENTATION from 11/08/2022 in [facility] CARDIAC REHABILITATION  Date 11/08/22       Visit Diagnosis: Status post coronary artery stent placement  S/P PTCA (percutaneous transluminal coronary angioplasty)  Patient's Home Medications on Admission:  Current Outpatient Medications:    aspirin EC 81 MG tablet, Take 81 mg by mouth daily., Disp: , Rfl:    brimonidine (ALPHAGAN) 0.2 % ophthalmic solution, Place 1 drop into both eyes 3 (three) times daily., Disp: , Rfl:    carvedilol (COREG) 12.5 MG tablet, Take 12.5 mg by mouth 2 (two) times daily with a meal., Disp: , Rfl:    cholecalciferol (VITAMIN D3) 10 MCG (400 UNIT) TABS tablet, Take 1,000 Units by mouth daily in the afternoon., Disp: , Rfl:    diphenhydrAMINE (BENADRYL) 25 mg capsule, Take 25 mg by mouth every 6 (six) hours as needed for itching., Disp: , Rfl:    dorzolamide-timolol (COSOPT) 22.3-6.8 MG/ML ophthalmic solution, Place 1 drop into both eyes 2 (two) times daily., Disp: , Rfl:    DULoxetine (CYMBALTA) 20 MG capsule, Take 20 mg by mouth daily., Disp: , Rfl:    latanoprost (XALATAN) 0.005 % ophthalmic solution, Place 1 drop into both eyes at bedtime., Disp: , Rfl:    Magnesium Oxide, Antacid, 500 MG CAPS, Take 500 mg by mouth daily., Disp: , Rfl:    metFORMIN (GLUCOPHAGE-XR) 500 MG 24 hr tablet, Take 1,000 mg by mouth 2 (two) times daily., Disp: , Rfl:    Netarsudil Dimesylate 0.02 % SOLN, Apply 1 drop to eye at bedtime. Place 1 drop into the right eye at bedtime, Disp: , Rfl:    omeprazole (PRILOSEC) 10 MG capsule, Take 10 mg by mouth daily., Disp: , Rfl:     rosuvastatin (CRESTOR) 40 MG tablet, Take 40 mg by mouth at bedtime., Disp: , Rfl:    sitaGLIPtin (JANUVIA) 100 MG tablet, Take 100 mg by mouth daily., Disp: , Rfl:    traZODone (DESYREL) 100 MG tablet, Take 100 mg by mouth at bedtime., Disp: , Rfl:    Vitamin D, Ergocalciferol, (DRISDOL) 50000 units CAPS capsule, Take 50,000 Units by mouth every Monday., Disp: , Rfl:   Past Medical History: Past Medical History:  Diagnosis Date   Arthritis    Arthropathy, unspecified, site unspecified    Congestive cardiac failure (HCC)    Coronary artery disease    Heart abnormality    hole in heart and 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 according to pt   Other and unspecified noninfectious gastroenteritis and colitis(558.9)    Other specified disease of pancreas    Personal history of urinary calculi    Stroke [facility]    Nov 2012   Type II or unspecified type diabetes mellitus without mention of complication, not stated as uncontrolled    Unspecified asthma(493.90)    Unspecified essential hypertension     Tobacco Use: Social History   Tobacco Use  Smoking Status Never  Smokeless Tobacco Never    Labs: Review Flowsheet       Latest Ref Rng & Units 07/08/2006 02/20/2011  Labs for ITP Cardiac and Pulmonary Rehab  Cholestrol 0 - 200 mg/dL -  191   LDL (calc) 0 - 99 mg/dL - 098   HDL-C >11 mg/dL - 57   Trlycerides <914 mg/dL - 94   Hemoglobin N8G <9.5 % 7.3  7.5     Details            Capillary Blood Glucose: Lab Results  Component Value Date   GLUCAP 156 (H) 11/23/2022   GLUCAP 159 (H) 11/23/2022   GLUCAP 89 11/21/2022   GLUCAP 148 (H) 11/21/2022   GLUCAP 119 (H) 11/19/2022     Exercise Target Goals: Exercise Program Goal: Individual exercise prescription set using results from initial 6 min walk test and THRR while considering  patient's activity barriers and safety.   Exercise Prescription Goal: Starting with aerobic activity 30 plus minutes a day, 3 days per week for initial exercise  prescription. Provide home exercise prescription and guidelines that participant acknowledges understanding prior to discharge.  Activity Barriers & Risk Stratification:  Activity Barriers & Cardiac Risk Stratification - 11/08/22 1305       Activity Barriers & Cardiac Risk Stratification   Activity Barriers Arthritis;Joint Problems;Deconditioning;Muscular Weakness;Balance Concerns    Cardiac Risk Stratification Moderate             6 Minute Walk:  6 Minute Walk     Row Name 11/08/22 1432         6 Minute Walk   Phase Initial     Distance 1238 feet     Walk Time 6 minutes     # of Rest Breaks 0     MPH 2.34     METS 2.26     RPE 10     VO2 Peak 7.92     Symptoms No     Resting HR 68 bpm     Resting BP 118/56     Resting Oxygen Saturation  99 %     Exercise Oxygen Saturation  during 6 min walk 100 %     Max Ex. HR 107 bpm     Max Ex. BP 128/64     2 Minute Post BP 120/64              Oxygen Initial Assessment:   Oxygen Re-Evaluation:   Oxygen Discharge (Final Oxygen Re-Evaluation):   Initial Exercise Prescription:  Initial Exercise Prescription - 11/08/22 1400       Date of Initial Exercise RX and Referring Provider   Date 11/08/22    Referring Provider Cyndia Bent MD   [name], [place]     Oxygen   Maintain Oxygen Saturation 88% or higher      Treadmill   MPH 2.2    Grade 0.5    Minutes 15    METs 2.84      NuStep   Level 2    SPM 80    Minutes 15    METs 2.5      Prescription Details   Frequency (times per week) 3    Duration Progress to 30 minutes of continuous aerobic without signs/symptoms of physical distress      Intensity   THRR 40-80% of Max Heartrate 100-131    Ratings of Perceived Exertion 11-13    Perceived Dyspnea 0-4      Progression   Progression Continue to progress workloads to maintain intensity without signs/symptoms of physical distress.      Resistance Training   Training Prescription Yes    Weight 3 lb     Reps 10-15  Perform Capillary Blood Glucose checks as needed.  Exercise Prescription Changes:   Exercise Prescription Changes     Row Name 11/08/22 1400 11/21/22 1500 12/17/22 1500         Response to Exercise   Blood Pressure (Admit) 118/56 148/60 110/60     Blood Pressure (Exercise) 128/64 -- --     Blood Pressure (Exit) 120/64 120/50 110/56     Heart Rate (Admit) 68 bpm 61 bpm 69 bpm     Heart Rate (Exercise) 107 bpm 85 bpm 98 bpm     Heart Rate (Exit) 70 bpm 64 bpm 75 bpm     Oxygen Saturation (Admit) 99 % -- --     Oxygen Saturation (Exercise) 100 % -- --     Rating of Perceived Exertion (Exercise) 10 13 13      Symptoms none -- --     Comments walk test results -- --     Duration -- Continue with 30 min of aerobic exercise without signs/symptoms of physical distress. Continue with 30 min of aerobic exercise without signs/symptoms of physical distress.     Intensity -- THRR unchanged THRR unchanged       Progression   Progression -- Continue to progress workloads to maintain intensity without signs/symptoms of physical distress. Continue to progress workloads to maintain intensity without signs/symptoms of physical distress.       Resistance Training   Training Prescription -- Yes Yes     Weight -- 3 3     Reps -- 10-15 10-15       Treadmill   MPH -- 1.9 2.1     Grade -- 0 0     Minutes -- 15 15     METs -- 2.45 2.64       NuStep   Level -- 1 3     SPM -- 50 73     Minutes -- 15 15     METs -- 2 2.3       Oxygen   Maintain Oxygen Saturation -- 88% or higher 88% or higher              Exercise Comments:   Exercise Comments     Row Name 11/12/22 1557           Exercise Comments First full day of exercise!  Patient was oriented to gym and equipment including functions, settings, policies, and procedures.  Patient's individual exercise prescription and treatment plan were reviewed.  All starting workloads were established based on  the results of the 6 minute walk test done at initial orientation visit.  The plan for exercise progression was also introduced and progression will be customized based on patient's performance and goals.                Exercise Goals and Review:   Exercise Goals     Row Name 11/08/22 1440             Exercise Goals   Increase Physical Activity Yes       Intervention Provide advice, education, support and counseling about physical activity/exercise needs.;Develop an individualized exercise prescription for aerobic and resistive training based on initial evaluation findings, risk stratification, comorbidities and participant's personal goals.       Expected Outcomes Short Term: Attend rehab on a regular basis to increase amount of physical activity.;Long Term: Add in home exercise to make exercise part of routine and to increase amount of physical activity.;Long Term: Exercising regularly at  least 3-5 days a week.       Increase Strength and Stamina Yes       Intervention Provide advice, education, support and counseling about physical activity/exercise needs.;Develop an individualized exercise prescription for aerobic and resistive training based on initial evaluation findings, risk stratification, comorbidities and participant's personal goals.       Expected Outcomes Short Term: Increase workloads from initial exercise prescription for resistance, speed, and METs.;Short Term: Perform resistance training exercises routinely during rehab and add in resistance training at home;Long Term: Improve cardiorespiratory fitness, muscular endurance and strength as measured by increased METs and functional capacity ( )       Able to understand and use rate of perceived exertion (RPE) scale Yes       Intervention Provide education and explanation on how to use RPE scale       Expected Outcomes Short Term: Able to use RPE daily in rehab to express subjective intensity level;Long Term:  Able to use  RPE to guide intensity level when exercising independently       Able to understand and use Dyspnea scale Yes       Intervention Provide education and explanation on how to use Dyspnea scale       Expected Outcomes Long Term: Able to use Dyspnea scale to guide intensity level when exercising independently;Short Term: Able to use Dyspnea scale daily in rehab to express subjective sense of shortness of breath during exertion       Knowledge and understanding of Target Heart Rate Range (THRR) Yes       Intervention Provide education and explanation of THRR including how the numbers were predicted and where they are located for reference       Expected Outcomes Short Term: Able to state/look up THRR;Long Term: Able to use THRR to govern intensity when exercising independently;Short Term: Able to use daily as guideline for intensity in rehab       Able to check pulse independently Yes       Intervention Provide education and demonstration on how to check pulse in carotid and radial arteries.;Review the importance of being able to check your own pulse for safety during independent exercise       Expected Outcomes Short Term: Able to explain why pulse checking is important during independent exercise;Long Term: Able to check pulse independently and accurately       Understanding of Exercise Prescription Yes       Intervention Provide education, explanation, and written materials on patient's individual exercise prescription       Expected Outcomes Short Term: Able to explain program exercise prescription;Long Term: Able to explain home exercise prescription to exercise independently                Exercise Goals Re-Evaluation :  Exercise Goals Re-Evaluation     Row Name 11/08/22 1442 11/22/22 0947 12/03/22 1440         Exercise Goal Re-Evaluation   Exercise Goals Review Able to understand and use Dyspnea scale;Able to understand and use rate of perceived exertion (RPE) scale;Understanding of  Exercise Prescription Increase Physical Activity;Understanding of Exercise Prescription;Increase Strength and Stamina Increase Physical Activity;Increase Strength and Stamina;Understanding of Exercise Prescription     Comments Reviewed RPE  and dyspnea scale,and program prescription with pt today.  Pt voiced understanding and was given a copy of goals to take home. Vanessa [name] has not increased her worklaods in the past week. She is currently exercising at an RPE of 13  on both sets. Will continue to monitor and progress as able , Vanessa [name] has increased her workload on the stepper to level 3 and has been doing great with her SPM. She is able to go out and do everyday activities wothout needing to stop. She stated that she is able to notice her engery level and knows when she needs tp take breaks.     Expected Outcomes Short: Use RPE daily to regulate intensity.  Long: Follow program prescription Short term; Increase workload on the stepper in  the next week   long term: continue to attend cardiac rehab. Short term: continue to workloads and increase SPM   long term: continue to attend cardiac rehab               Discharge Exercise Prescription (Final Exercise Prescription Changes):  Exercise Prescription Changes - 12/17/22 1500       Response to Exercise   Blood Pressure (Admit) 110/60    Blood Pressure (Exit) 110/56    Heart Rate (Admit) 69 bpm    Heart Rate (Exercise) 98 bpm    Heart Rate (Exit) 75 bpm    Rating of Perceived Exertion (Exercise) 13    Duration Continue with 30 min of aerobic exercise without signs/symptoms of physical distress.    Intensity THRR unchanged      Progression   Progression Continue to progress workloads to maintain intensity without signs/symptoms of physical distress.      Resistance Training   Training Prescription Yes    Weight 3    Reps 10-15      Treadmill   MPH 2.1    Grade 0    Minutes 15    METs 2.64      NuStep   Level 3    SPM 73    Minutes 15     METs 2.3      Oxygen   Maintain Oxygen Saturation 88% or higher             Nutrition:  Target Goals: Understanding of nutrition guidelines, daily intake of sodium 1500mg , cholesterol 200mg , calories 30% from fat and 7% or less from saturated fats, daily to have 5 or more servings of fruits and vegetables.  Biometrics:  Pre Biometrics - 11/08/22 1443       Pre Biometrics   Height 5' 2.5" (1.588 m)    Weight 188 lb 8 oz (85.5 kg)    Waist Circumference 38.75 inches    Hip Circumference 45 inches    Waist to Hip Ratio 0.86 %    BMI (Calculated) 33.91    Grip Strength 12.8 kg    Single Leg Stand 2.1 seconds              Nutrition Therapy Plan and Nutrition Goals:  Nutrition Therapy & Goals - 11/08/22 1453       Nutrition Therapy   Diet Currently follow [place] Diet      Intervention Plan   Intervention Prescribe, educate and counsel regarding individualized specific dietary modifications aiming towards targeted core components such as weight, hypertension, lipid management, diabetes, heart failure and other comorbidities.;Nutrition handout(s) given to patient.    Expected Outcomes Short Term Goal: Understand basic principles of dietary content, such as calories, fat, sodium, cholesterol and nutrients.             Nutrition Assessments:  MEDIFICTS Score Key: >=70 Need to make dietary changes  40-70 Heart Healthy Diet <= 40 Therapeutic Level Cholesterol Diet  Flowsheet  Row CARDIAC REHAB PHASE II ORIENTATION from 11/08/2022 in [facility] CARDIAC REHABILITATION  Picture Your Plate Total Score on Admission 66      Picture Your Plate Scores: <16 Unhealthy dietary pattern with much room for improvement. 41-50 Dietary pattern unlikely to meet recommendations for good health and room for improvement. 51-60 More healthful dietary pattern, with some room for improvement.  >60 Healthy dietary pattern, although there may be some specific behaviors that could  be improved.    Nutrition Goals Re-Evaluation:  Nutrition Goals Re-Evaluation     Row Name 12/03/22 1446             Goals   Nutrition Goal healthy eating       Comment She is watching what she has been eating in the past month. She has started the [place] diet and her PCP does know. WIth this she is eating more fish, mostly white fish, and chicken. She is also eating more colorfull veggies and fruits. She is eating smaller portions and has been enjoying what she eats. She did state that she has wanted fried chicken really bad and I told her once in a blue moon would be decent to treat herself.       Expected Outcome Short term: try salmon with meals instead of a white fish   long term: continue to eat a healthy diet                Nutrition Goals Discharge (Final Nutrition Goals Re-Evaluation):  Nutrition Goals Re-Evaluation - 12/03/22 1446       Goals   Nutrition Goal healthy eating    Comment She is watching what she has been eating in the past month. She has started the [place] diet and her PCP does know. WIth this she is eating more fish, mostly white fish, and chicken. She is also eating more colorfull veggies and fruits. She is eating smaller portions and has been enjoying what she eats. She did state that she has wanted fried chicken really bad and I told her once in a blue moon would be decent to treat herself.    Expected Outcome Short term: try salmon with meals instead of a white fish   long term: continue to eat a healthy diet             Psychosocial: Target Goals: Acknowledge presence or absence of significant depression and/or stress, maximize coping skills, provide positive support system. Participant is able to verbalize types and ability to use techniques and skills needed for reducing stress and depression.  Initial Review & Psychosocial Screening:  Initial Psych Review & Screening - 11/08/22 1307       Initial Review   Current issues with  Current Stress Concerns    Source of Stress Concerns Chronic Illness;Family;Unable to participate in former interests or hobbies;Unable to perform yard/household activities    Comments daughter in [place] (just lost husband), tries not to let things get to her, goes to senior center in [place], would like to get back to volunteering, good sleeper since stomach surgery, tends to forget about herself at times      [facility]   Good Support System? Yes   daughter, friends at [employer], pastor at [employer]   Psychosocial barriers to participate in program The patient should benefit from training in stress management and relaxation.;Psychosocial barriers identified (see note)      Screening Interventions   Interventions Encouraged to exercise;Provide feedback  about the scores to participant;To provide support and resources with identified psychosocial needs    Expected Outcomes Short Term goal: Utilizing psychosocial counselor, staff and physician to assist with identification of specific Stressors or current issues interfering with healing process. Setting desired goal for each stressor or current issue identified.;Long Term Goal: Stressors or current issues are controlled or eliminated.;Short Term goal: Identification and review with participant of any Quality of Life or Depression concerns found by scoring the questionnaire.;Long Term goal: The participant improves quality of Life and PHQ9 Scores as seen by post scores and/or verbalization of changes             Quality of Life Scores:  Quality of Life - 11/08/22 1453       Quality of Life   Select Quality of Life      Quality of Life Scores   Health/Function Pre 28 %    Socioeconomic Pre 30 %    Psych/Spiritual Pre 30 %    Family Pre 30 %    GLOBAL Pre 29.12 %            Scores of 19 and below usually indicate a poorer quality of life in these areas.  A difference of  2-3 points is a clinically meaningful difference.   A difference of 2-3 points in the total score of the Quality of Life Index has been associated with significant improvement in overall quality of life, self-image, physical symptoms, and general health in studies assessing change in quality of life.  PHQ-9: Review Flowsheet       11/08/2022  Depression screen PHQ 2/9  Decreased Interest 1  Down, Depressed, Hopeless 0  PHQ - 2 Score 1  Altered sleeping 2  Tired, decreased energy 1  Change in appetite 0  Feeling bad or failure about yourself  0  Trouble concentrating 0  Moving slowly or fidgety/restless 0  Suicidal thoughts 0  PHQ-9 Score 4  Difficult doing work/chores Not difficult at all    Details           Interpretation of Total Score  Total Score Depression Severity:  1-4 = Minimal depression, 5-9 = Mild depression, 10-14 = Moderate depression, 15-19 = Moderately severe depression, 20-27 = Severe depression   Psychosocial Evaluation and Intervention:  Psychosocial Evaluation - 11/08/22 1444       Psychosocial Evaluation & Interventions   Interventions Encouraged to exercise with the program and follow exercise prescription    Comments Vanessa [name] is coming to cardiac rehab after a stent and PTCA.  She is super excited to start rehab and feels that she is going to get a lot out of it.  She has recently lost a lot of weight through diet and exercise on her own.  She wants to get stronger and build her stamina back up.  She also wants to boost her confidence in her heart as well.  She is a very independently woman and was discouraged when she had to go live with her daughter for a while after surgery.  She missed her independence and is eager to get it all back.  While living with her daughter in [place], her daughter's husband passed, so they were able to be together and lift each other back up.  Vanessa [name] is very acitve in the [employer] and at the [employer].  She has friends everywhere that look in on her.  She goes to exercise  classes at [facility]. She is planning to use  transportation to get to rehab and will get that set up today.  Otherwise, she has no barriers to attending rehab.  She is a happy person and sleeps well most nights.  She has a history of some anxiety but currently doing well.    Expected Outcomes Short: Attend rehab to boost stamina Long; conitnue to exercise for mental boost    Continue Psychosocial Services  Follow up required by staff             Psychosocial Re-Evaluation:  Psychosocial Re-Evaluation     Row Name 12/03/22 1443             Psychosocial Re-Evaluation   Current issues with None Identified;Current Stress Concerns       Comments Vanessa [name] stated that when things start to stress her out she "deletes" them from her life. She stated that her health is more important to her then allowing things to stress her out.       Expected Outcomes Short term: continue to reconize stressors   long term: continue to exericse to improve health and happiness       Interventions Stress management education;Relaxation education;Encouraged to attend Cardiac Rehabilitation for the exercise       Continue Psychosocial Services  Follow up required by staff                Psychosocial Discharge (Final Psychosocial Re-Evaluation):  Psychosocial Re-Evaluation - 12/03/22 1443       Psychosocial Re-Evaluation   Current issues with None Identified;Current Stress Concerns    Comments Vanessa [name] stated that when things start to stress her out she "deletes" them from her life. She stated that her health is more important to her then allowing things to stress her out.    Expected Outcomes Short term: continue to reconize stressors   long term: continue to exericse to improve health and happiness    Interventions Stress management education;Relaxation education;Encouraged to attend Cardiac Rehabilitation for the exercise    Continue Psychosocial Services  Follow up required by staff              Vocational Rehabilitation: Provide vocational rehab assistance to qualifying candidates.   Vocational Rehab Evaluation & Intervention:  Vocational Rehab - 11/08/22 1306       Initial Vocational Rehab Evaluation & Intervention   Assessment shows need for Vocational Rehabilitation No   retired            Education: Education Goals: Education classes will be provided on a weekly basis, covering required topics. Participant will state understanding/return demonstration of topics presented.  Learning Barriers/Preferences:  Learning Barriers/Preferences - 11/08/22 1305       Learning Barriers/Preferences   Learning Barriers Sight   glasses   Learning Preferences Skilled Demonstration             Education Topics: Hypertension, Hypertension Reduction -Define heart disease and high blood pressure. Discus how high blood pressure affects the body and ways to reduce high blood pressure.   Exercise and Your Heart -Discuss why it is important to exercise, the FITT principles of exercise, normal and abnormal responses to exercise, and how to exercise safely.   Angina -Discuss definition of angina, causes of angina, treatment of angina, and how to decrease risk of having angina.   Cardiac Medications -Review what the following cardiac medications are used for, how they affect the body, and side effects that may occur when taking the medications.  Medications include Aspirin, Beta blockers, calcium channel  blockers, ACE Inhibitors, angiotensin receptor blockers, diuretics, digoxin, and antihyperlipidemics. Flowsheet Row CARDIAC REHAB PHASE II EXERCISE from 12/12/2022 in [place] CARDIAC REHABILITATION  Date 12/05/22  Educator DJ  Instruction Review Code 1- Verbalizes Understanding       Congestive Heart Failure -Discuss the definition of CHF, how to live with CHF, the signs and symptoms of CHF, and how keep track of weight and sodium intake.   Heart Disease and  Intimacy -Discus the effect sexual activity has on the heart, how changes occur during intimacy as we age, and safety during sexual activity. Flowsheet Row CARDIAC REHAB PHASE II EXERCISE from 12/12/2022 in [place] CARDIAC REHABILITATION  Date 11/14/22  Educator [facility]  Instruction Review Code 1- Verbalizes Understanding       Smoking Cessation / COPD -Discuss different methods to quit smoking, the health benefits of quitting smoking, and the definition of COPD. Flowsheet Row CARDIAC REHAB PHASE II EXERCISE from 12/12/2022 in [place] CARDIAC REHABILITATION  Date 12/12/22  Educator [facility]  Instruction Review Code 1- Verbalizes Understanding       Nutrition I: Fats -Discuss the types of cholesterol, what cholesterol does to the heart, and how cholesterol levels can be controlled. Flowsheet Row CARDIAC REHAB PHASE II EXERCISE from 12/12/2022 in [place] CARDIAC REHABILITATION  Date 11/21/22  Educator HB       Nutrition II: Labels -Discuss the different components of food labels and how to read food label Flowsheet Row CARDIAC REHAB PHASE II EXERCISE from 12/12/2022 in [place] [facility]  Date 11/21/22  Educator HB       Heart Parts/Heart Disease and PAD -Discuss the anatomy of the heart, the pathway of blood circulation through the heart, and these are affected by heart disease.   Stress I: Signs and Symptoms -Discuss the causes of stress, how stress may lead to anxiety and depression, and ways to limit stress. Flowsheet Row CARDIAC REHAB PHASE II EXERCISE from 12/12/2022 in [place] CARDIAC REHABILITATION  Date 11/28/22  Educator HB  Instruction Review Code 1- Verbalizes Understanding       Stress II: Relaxation -Discuss different types of relaxation techniques to limit stress. Flowsheet Row CARDIAC REHAB PHASE II EXERCISE from 12/12/2022 in [place] CARDIAC REHABILITATION  Date 11/28/22  Educator HB  Instruction Review Code 1- Verbalizes Understanding        Warning Signs of Stroke / TIA -Discuss definition of a stroke, what the signs and symptoms are of a stroke, and how to identify when someone is having stroke.   Knowledge Questionnaire Score:  Knowledge Questionnaire Score - 11/08/22 1454       Knowledge Questionnaire Score   Pre Score 18/24             Core Components/Risk Factors/Patient Goals at Admission:  Personal Goals and Risk Factors at Admission - 11/08/22 1454       Core Components/Risk Factors/Patient Goals on Admission    Weight Management Yes;Obesity;Weight Loss    Intervention Weight Management: Develop a combined nutrition and exercise program designed to reach desired caloric intake, while maintaining appropriate intake of nutrient and fiber, sodium and fats, and appropriate energy expenditure required for the weight goal.;Weight Management: Provide education and appropriate resources to help participant work on and attain dietary goals.;Weight Management/Obesity: Establish reasonable short term and long term weight goals.;Obesity: Provide education and appropriate resources to help participant work on and attain dietary goals.    Admit Weight 188 lb 8 oz (85.5 kg)  Goal Weight: Short Term 183 lb (83 kg)    Goal Weight: Long Term 180 lb (81.6 kg)    Expected Outcomes Short Term: Continue to assess and modify interventions until short term weight is achieved;Long Term: Adherence to nutrition and physical activity/exercise program aimed toward attainment of established weight goal;Weight Loss: Understanding of general recommendations for a balanced deficit meal plan, which promotes 1-2 lb weight loss per week and includes a negative energy balance of [phone] kcal/d;Understanding recommendations for meals to include 15-35% energy as protein, 25-35% energy from fat, 35-60% energy from carbohydrates, less than 200mg  of dietary cholesterol, 20-35 gm of total fiber daily;Understanding of distribution of calorie  intake throughout the day with the consumption of 4-5 meals/snacks    Diabetes Yes    Intervention Provide education about signs/symptoms and action to take for hypo/hyperglycemia.;Provide education about proper nutrition, including hydration, and aerobic/resistive exercise prescription along with prescribed medications to achieve blood glucose in normal ranges: Fasting glucose 65-99 mg/dL    Expected Outcomes Short Term: Participant verbalizes understanding of the signs/symptoms and immediate care of hyper/hypoglycemia, proper foot care and importance of medication, aerobic/resistive exercise and nutrition plan for blood glucose control.;Long Term: Attainment of HbA1C < 7%.    Hypertension Yes    Intervention Provide education on lifestyle modifcations including regular physical activity/exercise, weight management, moderate sodium restriction and increased consumption of fresh fruit, vegetables, and low fat dairy, alcohol moderation, and smoking cessation.;Monitor prescription use compliance.    Expected Outcomes Short Term: Continued assessment and intervention until BP is < 140/25mm HG in hypertensive participants. < 130/38mm HG in hypertensive participants with diabetes, heart failure or chronic kidney disease.;Long Term: Maintenance of blood pressure at goal levels.    Lipids Yes    Intervention Provide education and support for participant on nutrition & aerobic/resistive exercise along with prescribed medications to achieve LDL 70mg , HDL >40mg .    Expected Outcomes Short Term: Participant states understanding of desired cholesterol values and is compliant with medications prescribed. Participant is following exercise prescription and nutrition guidelines.;Long Term: Cholesterol controlled with medications as prescribed, with individualized exercise RX and with personalized nutrition plan. Value goals: LDL < 70mg , HDL > 40 mg.             Core Components/Risk Factors/Patient Goals Review:    Goals and Risk Factor Review     Row Name 12/03/22 1452             Core Components/Risk Factors/Patient Goals Review   Personal Goals Review Weight Management/Obesity;Diabetes;Hypertension       Review Vanessa [name] has been monitoring her weight, her goal is to lose about 10 more lbs. She checks her CBG everyday and it has been around 125-130.       Expected Outcomes Short term: exercise and continue to eat healthy for weight and diabetes    long term: continue to monitor and exercise                Core Components/Risk Factors/Patient Goals at Discharge (Final Review):   Goals and Risk Factor Review - 12/03/22 1452       Core Components/Risk Factors/Patient Goals Review   Personal Goals Review Weight Management/Obesity;Diabetes;Hypertension    Review Vanessa [name] has been monitoring her weight, her goal is to lose about 10 more lbs. She checks her CBG everyday and it has been around 125-130.    Expected Outcomes Short term: exercise and continue to eat healthy for weight and diabetes    long term: continue  to monitor and exercise             ITP Comments:  ITP Comments     Row Name 11/08/22 1428 11/12/22 1557 11/21/22 0938 12/19/22 1139     ITP Comments Patient attend orientation today.  Patient is attendingCardiac Rehabilitation Program.  Documentation for diagnosis can be found in [facility] 08/10/22.  Reviewed medical chart, RPE/RPD, gym safety, and program guidelines.  Patient was fitted to equipment they will be using during rehab.  Patient is scheduled to start exercise on 11/12/22 at 1445.   Initial ITP created and sent for review and signature by Dr. [name], Medical Director for Cardiac Rehabilitation Program. First full day of exercise!  Patient was oriented to gym and equipment including functions, settings, policies, and procedures.  Patient's individual exercise prescription and treatment plan were reviewed.  All starting workloads were established based on the results of the 6  minute walk test done at initial orientation visit.  The plan for exercise progression was also introduced and progression will be customized based on patient's performance and goals. 30 day review completed. ITP sent to Dr. [name], Medical Director of Cardiac Rehab. Continue with ITP unless changes are made by physician. new to program 30 day review completed. ITP sent to Dr. [name], Medical Director of Cardiac Rehab. Continue with ITP unless changes are made by physician.             Comments: 30 day review

## 2022-12-19 NOTE — Progress Notes (Signed)
Daily Session Note  Patient Details  Name: Vanessa [name] MRN: [id number] Date of Birth: Sep 12, 1950 Referring Provider:   Flowsheet Row CARDIAC REHAB PHASE II ORIENTATION from 11/08/2022 in [facility] CARDIAC REHABILITATION  Referring Provider Cyndia Bent MD  [name], PA]       Encounter Date: 12/19/2022  Check In:  Session Check In - 12/19/22 1400       Check-In   Supervising physician immediately available to respond to emergencies [facility] MD immediately available    Physician(s) Dr. Diona Browner    Location [facility]    Staff Present Ross Ludwig, BS, Exercise Physiologist;Juluis Fitzsimmons BSN, RN;Daphyne Daphine Deutscher, RN, BSN;Jessica Hawkins, MA, RCEP, CCRP, CCET    Virtual Visit No    Medication changes reported     No    Fall or balance concerns reported    No    Tobacco Cessation No Change    Warm-up and Cool-down Performed on first and last piece of equipment    Resistance Training Performed Yes    VAD Patient? No    PAD/SET Patient? No      Pain Assessment   Currently in Pain? No/denies    Multiple Pain Sites No             Capillary Blood Glucose: No results found for this or any previous visit (from the past 24 hour(s)).    Social History   Tobacco Use  Smoking Status Never  Smokeless Tobacco Never    Goals Met:  Independence with exercise equipment Exercise tolerated well No report of concerns or symptoms today Strength training completed today  Goals Unmet:  Not Applicable  Comments: Marland KitchenMarland KitchenPt able to follow exercise prescription today without complaint.  Will continue to monitor for progression.    Dr. [name] is Medical Director for [facility] Cardiac Rehab

## 2022-12-19 NOTE — Progress Notes (Signed)
Patient presents to Screening Unit for screening of Bp, Glucose and A1c. Patients Glucose and A1c was elevated. We discussed dietary changes, and increasing her daily activity to help to with her elevated numbers. She is scheduled to follow up with her PCP on 09.12.24.

## 2022-12-21 ENCOUNTER — Encounter (HOSPITAL_COMMUNITY): Payer: Medicare HMO

## 2022-12-24 ENCOUNTER — Encounter (HOSPITAL_COMMUNITY)
Admission: RE | Admit: 2022-12-24 | Discharge: 2022-12-24 | Disposition: A | Discharge status: Home / Self Care | Payer: Medicare HMO | Source: Ambulatory Visit | Attending: Internal Medicine | Admitting: Internal Medicine

## 2022-12-24 ENCOUNTER — Encounter (HOSPITAL_COMMUNITY): Payer: Medicare HMO

## 2022-12-24 DIAGNOSIS — Z9861 Coronary angioplasty status: Secondary | ICD-10-CM

## 2022-12-24 DIAGNOSIS — Z955 Presence of coronary angioplasty implant and graft: Secondary | ICD-10-CM | POA: Diagnosis not present

## 2022-12-24 NOTE — Progress Notes (Signed)
Reviewed home exercise with pt today.  Pt plans to walk at home and use staff videos for exercise.  Reviewed THR, pulse, RPE, sign and symptoms, pulse oximetery and when to call 911 or MD.  Also discussed weather considerations and indoor options.  Pt voiced understanding.

## 2022-12-24 NOTE — Progress Notes (Signed)
Daily Session Note  Patient Details  Name: Vanessa [name] MRN: [id number] Date of Birth: 1951-01-10 Referring Provider:   Flowsheet Row CARDIAC REHAB PHASE II ORIENTATION from 11/08/2022 in [facility] CARDIAC REHABILITATION  Referring Provider Cyndia Bent MD  [name], PA]       Encounter Date: 12/24/2022  Check In:  Session Check In - 12/24/22 1430       Check-In   Supervising physician immediately available to respond to emergencies See telemetry face sheet for immediately available MD    Location [facility]    Staff Present Ross Ludwig, BS, Exercise Physiologist;Debra Laural Benes, RN, BSN;Lexianna Weinrich, RN;Jessica Seis [place], MA, RCEP, CCRP, CCET    Virtual Visit No    Medication changes reported     No    Fall or balance concerns reported    No    Tobacco Cessation No Change    Warm-up and Cool-down Performed on first and last piece of equipment    Resistance Training Performed Yes    VAD Patient? No    PAD/SET Patient? No      Pain Assessment   Currently in Pain? No/denies    Multiple Pain Sites No             Capillary Blood Glucose: No results found for this or any previous visit (from the past 24 hour(s)).    Social History   Tobacco Use  Smoking Status Never  Smokeless Tobacco Never    Goals Met:  Independence with exercise equipment Exercise tolerated well No report of concerns or symptoms today  Goals Unmet:  Not Applicable  Comments: Pt able to follow exercise prescription today without complaint.  Will continue to monitor for progression.    Dr. [name] is Medical Director for [facility] Cardiac Rehab

## 2022-12-26 ENCOUNTER — Encounter (HOSPITAL_COMMUNITY)
Admission: RE | Admit: 2022-12-26 | Discharge: 2022-12-26 | Disposition: A | Discharge status: Home / Self Care | Payer: Medicare HMO | Source: Ambulatory Visit | Attending: Internal Medicine | Admitting: Internal Medicine

## 2022-12-26 ENCOUNTER — Encounter (HOSPITAL_COMMUNITY): Payer: Medicare HMO

## 2022-12-26 DIAGNOSIS — Z9861 Coronary angioplasty status: Secondary | ICD-10-CM

## 2022-12-26 DIAGNOSIS — Z955 Presence of coronary angioplasty implant and graft: Secondary | ICD-10-CM

## 2022-12-28 ENCOUNTER — Encounter (HOSPITAL_COMMUNITY): Payer: Medicare HMO

## 2022-12-31 ENCOUNTER — Encounter (HOSPITAL_COMMUNITY)
Admission: RE | Admit: 2022-12-31 | Discharge: 2022-12-31 | Disposition: A | Discharge status: Home / Self Care | Payer: Medicare HMO | Source: Ambulatory Visit | Attending: Internal Medicine | Admitting: Internal Medicine

## 2022-12-31 ENCOUNTER — Encounter (HOSPITAL_COMMUNITY): Payer: Medicare HMO

## 2022-12-31 DIAGNOSIS — Z9861 Coronary angioplasty status: Secondary | ICD-10-CM

## 2022-12-31 DIAGNOSIS — Z955 Presence of coronary angioplasty implant and graft: Secondary | ICD-10-CM

## 2022-12-31 NOTE — Progress Notes (Signed)
Daily Session Note  Patient Details  Name: Vanessa [name] MRN: [id number] Date of Birth: 04-16-1950 Referring Provider:   Flowsheet Row CARDIAC REHAB PHASE II ORIENTATION from 11/08/2022 in [facility] CARDIAC REHABILITATION  Referring Provider Vanessa Bent MD  Vanessa Bombard Ward, PA]       Encounter Date: 12/31/2022  Check In:  Session Check In - 12/31/22 1430       Check-In   Supervising physician immediately available to respond to emergencies See telemetry face sheet for immediately available MD    Location [facility]    Staff Present Vanessa [name], BS, Exercise Physiologist;Vanessa Daphine Deutscher, RN, BSN;Vanessa Hawkins, MA, RCEP, CCRP, CCET    Virtual Visit No    Medication changes reported     No    Fall or balance concerns reported    No    Tobacco Cessation No Change    Warm-up and Cool-down Performed on first and last piece of equipment    Resistance Training Performed Yes    VAD Patient? No    PAD/SET Patient? No      Pain Assessment   Currently in Pain? No/denies    Multiple Pain Sites No             Capillary Blood Glucose: No results found for this or any previous visit (from the past 24 hour(s)).    Social History   Tobacco Use  Smoking Status Never  Smokeless Tobacco Never    Goals Met:  Independence with exercise equipment Exercise tolerated well No report of concerns or symptoms today Strength training completed today  Goals Unmet:  Not Applicable  Comments: Pt able to follow exercise prescription today without complaint.  Will continue to monitor for progression.    Dr. Dina [name] is Medical Director for [facility] Cardiac Rehab

## 2023-01-02 ENCOUNTER — Encounter (HOSPITAL_COMMUNITY): Payer: Medicare HMO

## 2023-01-02 ENCOUNTER — Encounter (HOSPITAL_COMMUNITY)
Admission: RE | Admit: 2023-01-02 | Discharge: 2023-01-02 | Disposition: A | Discharge status: Home / Self Care | Payer: Medicare HMO | Source: Ambulatory Visit | Attending: Internal Medicine | Admitting: Internal Medicine

## 2023-01-02 DIAGNOSIS — Z955 Presence of coronary angioplasty implant and graft: Secondary | ICD-10-CM | POA: Diagnosis not present

## 2023-01-02 DIAGNOSIS — Z9861 Coronary angioplasty status: Secondary | ICD-10-CM

## 2023-01-02 NOTE — Progress Notes (Signed)
Daily Session Note  Patient Details  Name: Vanessa [name] MRN: [id number] Date of Birth: [birth year] Referring Provider:   Flowsheet Row CARDIAC REHAB PHASE II ORIENTATION from 11/08/2022 in [facility] CARDIAC REHABILITATION  Referring Provider Cyndia Bent MD  [name], PA]       Encounter Date: 01/02/2023  Check In:  Session Check In - 01/02/23 1400       Check-In   Supervising physician immediately available to respond to emergencies See telemetry face sheet for immediately available MD    Location [facility]    Staff Present Ross Ludwig, BS, Exercise Physiologist;Elis Rawlinson Daphine Deutscher, RN, BSN;Jessica Hawkins, MA, RCEP, CCRP, CCET    Medication changes reported     No    Fall or balance concerns reported    No    Tobacco Cessation No Change    Warm-up and Cool-down Performed on first and last piece of equipment    Resistance Training Performed Yes    VAD Patient? No             Capillary Blood Glucose: No results found for this or any previous visit (from the past 24 hour(s)).    Social History   Tobacco Use  Smoking Status Never  Smokeless Tobacco Never    Goals Met:  Independence with exercise equipment Exercise tolerated well No report of concerns or symptoms today Strength training completed today  Goals Unmet:  Not Applicable  Comments: Pt able to follow exercise prescription today without complaint.  Will continue to monitor for progression.    Dr. [name] is Medical Director for [facility] Cardiac Rehab

## 2023-01-04 ENCOUNTER — Encounter (HOSPITAL_COMMUNITY): Payer: Medicare HMO

## 2023-01-07 ENCOUNTER — Encounter (HOSPITAL_COMMUNITY): Payer: Medicare HMO

## 2023-01-07 ENCOUNTER — Encounter (HOSPITAL_COMMUNITY)
Admission: RE | Admit: 2023-01-07 | Discharge: 2023-01-07 | Disposition: A | Discharge status: Home / Self Care | Payer: Medicare HMO | Source: Ambulatory Visit | Attending: Internal Medicine | Admitting: Internal Medicine

## 2023-01-07 DIAGNOSIS — Z955 Presence of coronary angioplasty implant and graft: Secondary | ICD-10-CM | POA: Diagnosis not present

## 2023-01-08 ENCOUNTER — Encounter: Payer: Self-pay | Admitting: *Deleted

## 2023-01-08 NOTE — Progress Notes (Signed)
Pt attended 12/19/2022 screening event where her b/p was 122/70 and her non-fasting blood sugar was 280 and her A1C was 7.7. At the event, the pt noted utility insecurities but event nurse noted the staff at the event did not have utility resources so share with pt at that time for her home area. Chart review indicates pt sees Dr Craig Guess Clelia Croft) Charyl Bigger as her PCP at [facility] and pt had telemedicine visit with her PCP on 12/20/22 which documents a discussion with PCP, pt, and pt's dtr about her diabetes control and noted the A1C of 7.7 from the event. Pt has had cardiac rehab over the past multiple months and has a PCP appt on 01/15/23 & cardiology appt 04/15/23. Utility resource information for [employer] to pt. No additional health equity team support indicated at this time.

## 2023-01-09 ENCOUNTER — Encounter (HOSPITAL_COMMUNITY): Payer: Medicare HMO

## 2023-01-09 ENCOUNTER — Encounter (HOSPITAL_COMMUNITY)
Admission: RE | Admit: 2023-01-09 | Discharge: 2023-01-09 | Disposition: A | Discharge status: Home / Self Care | Payer: Medicare HMO | Source: Ambulatory Visit | Attending: Internal Medicine | Admitting: Internal Medicine

## 2023-01-09 DIAGNOSIS — Z955 Presence of coronary angioplasty implant and graft: Secondary | ICD-10-CM | POA: Insufficient documentation

## 2023-01-09 DIAGNOSIS — Z9861 Coronary angioplasty status: Secondary | ICD-10-CM | POA: Insufficient documentation

## 2023-01-09 NOTE — Progress Notes (Signed)
Daily Session Note  Patient Details  Name: Vanessa [name] MRN: [id number] Date of Birth: [birth year] Referring Provider:   Flowsheet Row CARDIAC REHAB PHASE II ORIENTATION from 11/08/2022 in [facility] CARDIAC REHABILITATION  Referring Provider Cyndia Bent MD  [name], PA]       Encounter Date: 01/09/2023  Check In:  Session Check In - 01/09/23 1400       Check-In   Supervising physician immediately available to respond to emergencies See telemetry face sheet for immediately available MD    Location [facility]    Staff Present Ross Ludwig, BS, Exercise Physiologist;Angely Dietz Leonidas Romberg BSN, RN;Daphyne Daphine Deutscher, RN, BSN    Virtual Visit No    Medication changes reported     No    Fall or balance concerns reported    No    Tobacco Cessation No Change    Warm-up and Cool-down Performed on first and last piece of equipment    Resistance Training Performed Yes    VAD Patient? No    PAD/SET Patient? No      Pain Assessment   Currently in Pain? No/denies    Multiple Pain Sites No             Capillary Blood Glucose: No results found for this or any previous visit (from the past 24 hour(s)).    Social History   Tobacco Use  Smoking Status Never  Smokeless Tobacco Never    Goals Met:  Independence with exercise equipment Exercise tolerated well No report of concerns or symptoms today Strength training completed today  Goals Unmet:  Not Applicable  Comments: Vanessa [name] KitchenMarland KitchenPt able to follow exercise prescription today without complaint.  Will continue to monitor for progression.    Dr. [name] is Medical Director for [facility] Cardiac Rehab

## 2023-01-11 ENCOUNTER — Encounter (HOSPITAL_COMMUNITY): Payer: Medicare HMO

## 2023-01-14 ENCOUNTER — Encounter (HOSPITAL_COMMUNITY)
Admission: RE | Admit: 2023-01-14 | Discharge: 2023-01-14 | Disposition: A | Discharge status: Home / Self Care | Payer: Medicare HMO | Source: Ambulatory Visit | Attending: Internal Medicine | Admitting: Internal Medicine

## 2023-01-14 ENCOUNTER — Encounter (HOSPITAL_COMMUNITY): Payer: Medicare HMO

## 2023-01-14 VITALS — Ht 62.5 in | Wt 180.3 lb

## 2023-01-14 DIAGNOSIS — Z955 Presence of coronary angioplasty implant and graft: Secondary | ICD-10-CM

## 2023-01-14 DIAGNOSIS — Z9861 Coronary angioplasty status: Secondary | ICD-10-CM

## 2023-01-14 NOTE — Patient Instructions (Signed)
Discharge Patient Instructions  Patient Details  Name: Vanessa [name] MRN: [id number] Date of Birth: 1950-07-10 Referring Provider:  Liana Crocker,*   Number of Visits: 36  Reason for Discharge:  Patient reached a stable level of exercise. Patient independent in their exercise. Patient has met program and personal goals.  Smoking History:  Social History   Tobacco Use  Smoking Status Never  Smokeless Tobacco Never    Diagnosis:  Status post coronary artery stent placement  S/P PTCA (percutaneous transluminal coronary angioplasty)  Initial Exercise Prescription:  Initial Exercise Prescription - 11/08/22 1400       Date of Initial Exercise RX and Referring Provider   Date 11/08/22    Referring Provider Cyndia Bent MD   [name], PA     Oxygen   Maintain Oxygen Saturation 88% or higher      Treadmill   MPH 2.2    Grade 0.5    Minutes 15    METs 2.84      NuStep   Level 2    SPM 80    Minutes 15    METs 2.5      Prescription Details   Frequency (times per week) 3    Duration Progress to 30 minutes of continuous aerobic without signs/symptoms of physical distress      Intensity   THRR 40-80% of Max Heartrate 100-131    Ratings of Perceived Exertion 11-13    Perceived Dyspnea 0-4      Progression   Progression Continue to progress workloads to maintain intensity without signs/symptoms of physical distress.      Resistance Training   Training Prescription Yes    Weight 3 lb    Reps 10-15             Discharge Exercise Prescription (Final Exercise Prescription Changes):  Exercise Prescription Changes - 01/02/23 1500       Response to Exercise   Blood Pressure (Admit) 124/62    Blood Pressure (Exit) 124/60    Heart Rate (Admit) 70 bpm    Heart Rate (Exercise) 97 bpm    Heart Rate (Exit) 79 bpm    Rating of Perceived Exertion (Exercise) 13    Duration Continue with 30 min of aerobic exercise without signs/symptoms of physical  distress.    Intensity THRR unchanged      Progression   Progression Continue to progress workloads to maintain intensity without signs/symptoms of physical distress.      Resistance Training   Training Prescription Yes    Weight 3lbs    Reps 10-15      Treadmill   MPH 2.1    Grade 0    Minutes 15    METs 2.61      NuStep   Level 3    SPM 89    Minutes 15    METs 2.5      Home Exercise Plan   Plans to continue exercise at Home (comment)    Frequency Add 3 additional days to program exercise sessions.      Oxygen   Maintain Oxygen Saturation 88% or higher             Functional Capacity:  6 Minute Walk     Row Name 11/08/22 1432 01/14/23 1508       6 Minute Walk   Phase Initial Discharge    Distance 1238 feet 1595 feet    Distance % Change -- 28.8 %    Distance Feet  Change -- 357 ft    Walk Time 6 minutes 6 minutes    # of Rest Breaks 0 0    MPH 2.34 3.02    METS 2.26 3    RPE 10 11    VO2 Peak 7.92 10.51    Symptoms No No    Resting HR 68 bpm 62 bpm    Resting BP 118/56 126/64    Resting Oxygen Saturation  99 % --    Exercise Oxygen Saturation  during 6 min walk 100 % --    Max Ex. HR 107 bpm 107 bpm    Max Ex. BP 128/64 134/62    2 Minute Post BP 120/64 --             Nutrition & Weight - Outcomes:  Pre Biometrics - 11/08/22 1443       Pre Biometrics   Height 5' 2.5" (1.588 m)    Weight 85.5 kg    Waist Circumference 38.75 inches    Hip Circumference 45 inches    Waist to Hip Ratio 0.86 %    BMI (Calculated) 33.91    Grip Strength 12.8 kg    Single Leg Stand 2.1 seconds             Post Biometrics - 01/14/23 1509        Post  Biometrics   Height 5' 2.5" (1.588 m)    Weight 81.8 kg    Waist Circumference 34 inches    Hip Circumference 41 inches    Waist to Hip Ratio 0.83 %    BMI (Calculated) 32.43    Grip Strength 18.2 kg    Single Leg Stand 8.6 seconds

## 2023-01-14 NOTE — Progress Notes (Signed)
Daily Session Note  Patient Details  Name: Vanessa [name] MRN: [id number] Date of Birth: 1950-07-08 Referring Provider:   Flowsheet Row CARDIAC REHAB PHASE II ORIENTATION from 11/08/2022 in [facility] C[facility]AC [facility]IL[facility]ATION  Referring Provider Cyndia Bent MD  [name], PA]       Encounter Date: 01/14/2023  Check In:  Session Check In - 01/14/23 1430       Check-In   Supervising physician immediately available to respond to emergencies See telemetry face sheet for immediately available MD    Location [facility]    Staff Present Erskine Speed, RN;Daphyne Daphine Deutscher, RN, BSN;Jessica [place], MA, RCEP, CCRP, Dow Adolph, RN, BSN    Virtual Visit No    Medication changes reported     No    Fall or balance concerns reported    No    Tobacco Cessation No Change    Warm-up and Cool-down Performed on first and last piece of equipment    Resistance Training Performed Yes    VAD Patient? No    PAD/SET Patient? No      Pain Assessment   Currently in Pain? No/denies    Multiple Pain Sites No             Capillary Blood Glucose: No results found for this or any previous visit (from the past 24 hour(s)).    Social History   Tobacco Use  Smoking Status Never  Smokeless Tobacco Never    Goals Met:  Independence with exercise equipment Exercise tolerated well No report of concerns or symptoms today Strength training completed today  Goals Unmet:  Not Applicable  Comments: Pt able to follow exercise prescription today without complaint.  Will continue to monitor for progression.    Dr. [name] is Medical Director for [facility]

## 2023-01-16 ENCOUNTER — Encounter (HOSPITAL_COMMUNITY): Payer: Medicare HMO

## 2023-01-16 ENCOUNTER — Encounter (HOSPITAL_COMMUNITY): Payer: Self-pay | Admitting: *Deleted

## 2023-01-16 DIAGNOSIS — Z955 Presence of coronary angioplasty implant and graft: Secondary | ICD-10-CM

## 2023-01-16 DIAGNOSIS — Z9861 Coronary angioplasty status: Secondary | ICD-10-CM

## 2023-01-16 NOTE — Progress Notes (Signed)
Cardiac Individual Treatment Plan  Patient Details  Name: Vanessa [name] MRN: [id number] Date of Birth: 1950-07-26 Referring Provider:   Flowsheet Row CARDIAC REHAB PHASE II ORIENTATION from 11/08/2022 in [facility] CARDIAC REHABILITATION  Referring Provider Cyndia Bent MD  [name], [facility]]       Initial Encounter Date:  Flowsheet Row CARDIAC REHAB PHASE II ORIENTATION from 11/08/2022 in [place] CARDIAC REHABILITATION  Date 11/08/22       Visit Diagnosis: Status post coronary artery stent placement  S/P PTCA (percutaneous transluminal coronary angioplasty)  Patient's Home Medications on Admission:  Current Outpatient Medications:    aspirin EC 81 MG tablet, Take 81 mg by mouth daily., Disp: , Rfl:    brimonidine (ALPHAGAN) 0.2 % ophthalmic solution, Place 1 drop into both eyes 3 (three) times daily., Disp: , Rfl:    carvedilol (COREG) 12.5 MG tablet, Take 12.5 mg by mouth 2 (two) times daily with a meal., Disp: , Rfl:    cholecalciferol (VITAMIN D3) 10 MCG (400 UNIT) TABS tablet, Take 1,000 Units by mouth daily in the afternoon., Disp: , Rfl:    diphenhydrAMINE (BENADRYL) 25 mg capsule, Take 25 mg by mouth every 6 (six) hours as needed for itching., Disp: , Rfl:    dorzolamide-timolol (COSOPT) 22.3-6.8 MG/ML ophthalmic solution, Place 1 drop into both eyes 2 (two) times daily., Disp: , Rfl:    DULoxetine (CYMBALTA) 20 MG capsule, Take 20 mg by mouth daily., Disp: , Rfl:    latanoprost (XALATAN) 0.005 % ophthalmic solution, Place 1 drop into both eyes at bedtime., Disp: , Rfl:    Magnesium Oxide, Antacid, 500 MG CAPS, Take 500 mg by mouth daily., Disp: , Rfl:    metFORMIN (GLUCOPHAGE-XR) 500 MG 24 hr tablet, Take 1,000 mg by mouth 2 (two) times daily., Disp: , Rfl:    Netarsudil Dimesylate 0.02 % SOLN, Apply 1 drop to eye at bedtime. Place 1 drop into the right eye at bedtime, Disp: , Rfl:    omeprazole (PRILOSEC) 10 MG capsule, Take 10 mg by mouth daily., Disp: , Rfl:     rosuvastatin (CRESTOR) 40 MG tablet, Take 40 mg by mouth at bedtime., Disp: , Rfl:    sitaGLIPtin (JANUVIA) 100 MG tablet, Take 100 mg by mouth daily., Disp: , Rfl:    traZODone (DESYREL) 100 MG tablet, Take 100 mg by mouth at bedtime., Disp: , Rfl:    Vitamin D, Ergocalciferol, (DRISDOL) 50000 units CAPS capsule, Take 50,000 Units by mouth every Monday., Disp: , Rfl:   Past Medical History: Past Medical History:  Diagnosis Date   Arthritis    Arthropathy, unspecified, site unspecified    Congestive cardiac failure (HCC)    Coronary artery disease    Heart abnormality    hole in heart and 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 according to pt   Other and unspecified noninfectious gastroenteritis and colitis(558.9)    Other specified disease of pancreas    Personal history of urinary calculi    Stroke [facility])    Nov 2012   Type II or unspecified type diabetes mellitus without mention of complication, not stated as uncontrolled    Unspecified asthma(493.90)    Unspecified essential hypertension     Tobacco Use: Social History   Tobacco Use  Smoking Status Never  Smokeless Tobacco Never    Labs: Review Flowsheet       Latest Ref Rng & Units 07/08/2006 02/20/2011 12/19/2022  Labs for ITP Cardiac and Pulmonary Rehab  Cholestrol 0 - 200  mg/dL - 119  -  LDL (calc) 0 - 99 mg/dL - 147  -  HDL-C >82 mg/dL - 57  -  Trlycerides <956 mg/dL - 94  -  Hemoglobin O1H - 7.3  7.5  7.7        Details       This result is from an external source.         Capillary Blood Glucose: Lab Results  Component Value Date   GLUCAP 156 (H) 11/23/2022   GLUCAP 159 (H) 11/23/2022   GLUCAP 89 11/21/2022   GLUCAP 148 (H) 11/21/2022   GLUCAP 119 (H) 11/19/2022     Exercise Target Goals: Exercise Program Goal: Individual exercise prescription set using results from initial 6 min walk test and THRR while considering  patient's activity barriers and safety.   Exercise Prescription Goal: Starting with aerobic  activity 30 plus minutes a day, 3 days per week for initial exercise prescription. Provide home exercise prescription and guidelines that participant acknowledges understanding prior to discharge.  Activity Barriers & Risk Stratification:  Activity Barriers & Cardiac Risk Stratification - 11/08/22 1305       Activity Barriers & Cardiac Risk Stratification   Activity Barriers Arthritis;Joint Problems;Deconditioning;Muscular Weakness;Balance Concerns    Cardiac Risk Stratification Moderate             6 Minute Walk:  6 Minute Walk     Row Name 11/08/22 1432 01/14/23 1508       6 Minute Walk   Phase Initial Discharge    Distance 1238 feet 1595 feet    Distance % Change -- 28.8 %    Distance Feet Change -- 357 ft    Walk Time 6 minutes 6 minutes    # of Rest Breaks 0 0    MPH 2.34 3.02    METS 2.26 3    RPE 10 11    VO2 Peak 7.92 10.51    Symptoms No No    Resting HR 68 bpm 62 bpm    Resting BP 118/56 126/64    Resting Oxygen Saturation  99 % --    Exercise Oxygen Saturation  during 6 min walk 100 % --    Max Ex. HR 107 bpm 107 bpm    Max Ex. BP 128/64 134/62    2 Minute Post BP 120/64 --             Oxygen Initial Assessment:   Oxygen Re-Evaluation:   Oxygen Discharge (Final Oxygen Re-Evaluation):   Initial Exercise Prescription:  Initial Exercise Prescription - 11/08/22 1400       Date of Initial Exercise RX and Referring Provider   Date 11/08/22    Referring Provider Cyndia Bent MD   [name], [place]     Oxygen   Maintain Oxygen Saturation 88% or higher      Treadmill   MPH 2.2    Grade 0.5    Minutes 15    METs 2.84      NuStep   Level 2    SPM 80    Minutes 15    METs 2.5      Prescription Details   Frequency (times per week) 3    Duration Progress to 30 minutes of continuous aerobic without signs/symptoms of physical distress      Intensity   THRR 40-80% of Max Heartrate 100-131    Ratings of Perceived Exertion 11-13     Perceived Dyspnea 0-4  Progression   Progression Continue to progress workloads to maintain intensity without signs/symptoms of physical distress.      Resistance Training   Training Prescription Yes    Weight 3 lb    Reps 10-15             Perform Capillary Blood Glucose checks as needed.  Exercise Prescription Changes:   Exercise Prescription Changes     Row Name 11/08/22 1400 11/21/22 1500 12/17/22 1500 12/24/22 1500 01/02/23 1500     Response to Exercise   Blood Pressure (Admit) 118/56 148/60 110/60 -- 124/62   Blood Pressure (Exercise) 128/64 -- -- -- --   Blood Pressure (Exit) 120/64 120/50 110/56 -- 124/60   Heart Rate (Admit) 68 bpm 61 bpm 69 bpm -- 70 bpm   Heart Rate (Exercise) 107 bpm 85 bpm 98 bpm -- 97 bpm   Heart Rate (Exit) 70 bpm 64 bpm 75 bpm -- 79 bpm   Oxygen Saturation (Admit) 99 % -- -- -- --   Oxygen Saturation (Exercise) 100 % -- -- -- --   Rating of Perceived Exertion (Exercise) 10 13 13  -- 13   Symptoms none -- -- -- --   Comments walk test results -- -- -- --   Duration -- Continue with 30 min of aerobic exercise without signs/symptoms of physical distress. Continue with 30 min of aerobic exercise without signs/symptoms of physical distress. -- Continue with 30 min of aerobic exercise without signs/symptoms of physical distress.   Intensity -- THRR unchanged THRR unchanged -- THRR unchanged     Progression   Progression -- Continue to progress workloads to maintain intensity without signs/symptoms of physical distress. Continue to progress workloads to maintain intensity without signs/symptoms of physical distress. -- Continue to progress workloads to maintain intensity without signs/symptoms of physical distress.     Resistance Training   Training Prescription -- Yes Yes -- Yes   Weight -- 3 3 -- 3lbs   Reps -- 10-15 10-15 -- 10-15     Treadmill   MPH -- 1.9 2.1 -- 2.1   Grade -- 0 0 -- 0   Minutes -- 15 15 -- 15   METs -- 2.45 2.64 --  2.61     NuStep   Level -- 1 3 -- 3   SPM -- 50 73 -- 89   Minutes -- 15 15 -- 15   METs -- 2 2.3 -- 2.5     Home Exercise Plan   Plans to continue exercise at -- -- -- Home (comment)  walking, staff videos Home (comment)   Frequency -- -- -- Add 3 additional days to program exercise sessions. Add 3 additional days to program exercise sessions.     Oxygen   Maintain Oxygen Saturation -- 88% or higher 88% or higher -- 88% or higher            Exercise Comments:   Exercise Comments     Row Name 11/12/22 1557           Exercise Comments First full day of exercise!  Patient was oriented to gym and equipment including functions, settings, policies, and procedures.  Patient's individual exercise prescription and treatment plan were reviewed.  All starting workloads were established based on the results of the 6 minute walk test done at initial orientation visit.  The plan for exercise progression was also introduced and progression will be customized based on patient's performance and goals.  Exercise Goals and Review:   Exercise Goals     Row Name 11/08/22 1440             Exercise Goals   Increase Physical Activity Yes       Intervention Provide advice, education, support and counseling about physical activity/exercise needs.;Develop an individualized exercise prescription for aerobic and resistive training based on initial evaluation findings, risk stratification, comorbidities and participant's personal goals.       Expected Outcomes Short Term: Attend rehab on a regular basis to increase amount of physical activity.;Long Term: Add in home exercise to make exercise part of routine and to increase amount of physical activity.;Long Term: Exercising regularly at least 3-5 days a week.       Increase Strength and Stamina Yes       Intervention Provide advice, education, support and counseling about physical activity/exercise needs.;Develop an  individualized exercise prescription for aerobic and resistive training based on initial evaluation findings, risk stratification, comorbidities and participant's personal goals.       Expected Outcomes Short Term: Increase workloads from initial exercise prescription for resistance, speed, and METs.;Short Term: Perform resistance training exercises routinely during rehab and add in resistance training at home;Long Term: Improve cardiorespiratory fitness, muscular endurance and strength as measured by increased METs and functional capacity ( )       Able to understand and use rate of perceived exertion (RPE) scale Yes       Intervention Provide education and explanation on how to use RPE scale       Expected Outcomes Short Term: Able to use RPE daily in rehab to express subjective intensity level;Long Term:  Able to use RPE to guide intensity level when exercising independently       Able to understand and use Dyspnea scale Yes       Intervention Provide education and explanation on how to use Dyspnea scale       Expected Outcomes Long Term: Able to use Dyspnea scale to guide intensity level when exercising independently;Short Term: Able to use Dyspnea scale daily in rehab to express subjective sense of shortness of breath during exertion       Knowledge and understanding of Target Heart Rate Range (THRR) Yes       Intervention Provide education and explanation of THRR including how the numbers were predicted and where they are located for reference       Expected Outcomes Short Term: Able to state/look up THRR;Long Term: Able to use THRR to govern intensity when exercising independently;Short Term: Able to use daily as guideline for intensity in rehab       Able to check pulse independently Yes       Intervention Provide education and demonstration on how to check pulse in carotid and radial arteries.;Review the importance of being able to check your own pulse for safety during independent exercise        Expected Outcomes Short Term: Able to explain why pulse checking is important during independent exercise;Long Term: Able to check pulse independently and accurately       Understanding of Exercise Prescription Yes       Intervention Provide education, explanation, and written materials on patient's individual exercise prescription       Expected Outcomes Short Term: Able to explain program exercise prescription;Long Term: Able to explain home exercise prescription to exercise independently                Exercise Goals Re-Evaluation :  Exercise Goals Re-Evaluation     Row Name 11/08/22 1442 11/22/22 0947 12/03/22 1440 12/24/22 1507       Exercise Goal Re-Evaluation   Exercise Goals Review Able to understand and use Dyspnea scale;Able to understand and use rate of perceived exertion (RPE) scale;Understanding of Exercise Prescription Increase Physical Activity;Understanding of Exercise Prescription;Increase Strength and Stamina Increase Physical Activity;Increase Strength and Stamina;Understanding of Exercise Prescription Increase Physical Activity;Increase Strength and Stamina;Able to understand and use rate of perceived exertion (RPE) scale;Able to understand and use Dyspnea scale;Knowledge and understanding of Target Heart Rate Range (THRR);Able to check pulse independently;Understanding of Exercise Prescription    Comments Reviewed RPE  and dyspnea scale,and program prescription with pt today.  Pt voiced understanding and was given a copy of goals to take home. Jolette has not increased her worklaods in the past week. She is currently exercising at an RPE of 13 on both sets. Will continue to monitor and progress as able , Kynlei has increased her workload on the stepper to level 3 and has been doing great with her SPM. She is able to go out and do everyday activities wothout needing to stop. She stated that she is able to notice her engery level and knows when she needs tp take breaks. Karma has  been doing well in rehab.  She has found that coming 3x week is hard for her.  She would like to drop back to twice a week.  Reviewed home exercise with pt today.  Pt plans to walk at home and use staff videos for exercise.  Reviewed THR, pulse, RPE, sign and symptoms, pulse oximetery and when to call 911 or MD.  Also discussed weather considerations and indoor options.  Pt voiced understanding.    Expected Outcomes Short: Use RPE daily to regulate intensity.  Long: Follow program prescription Short term; Increase workload on the stepper in  the next week   long term: continue to attend cardiac rehab. Short term: continue to workloads and increase SPM   long term: continue to attend cardiac rehab Short: Add in exercise at home.  Long: Continue to exercise independently              Discharge Exercise Prescription (Final Exercise Prescription Changes):  Exercise Prescription Changes - 01/02/23 1500       Response to Exercise   Blood Pressure (Admit) 124/62    Blood Pressure (Exit) 124/60    Heart Rate (Admit) 70 bpm    Heart Rate (Exercise) 97 bpm    Heart Rate (Exit) 79 bpm    Rating of Perceived Exertion (Exercise) 13    Duration Continue with 30 min of aerobic exercise without signs/symptoms of physical distress.    Intensity THRR unchanged      Progression   Progression Continue to progress workloads to maintain intensity without signs/symptoms of physical distress.      Resistance Training   Training Prescription Yes    Weight 3lbs    Reps 10-15      Treadmill   MPH 2.1    Grade 0    Minutes 15    METs 2.61      NuStep   Level 3    SPM 89    Minutes 15    METs 2.5      Home Exercise Plan   Plans to continue exercise at Home (comment)    Frequency Add 3 additional days to program exercise sessions.      Oxygen   Maintain  Oxygen Saturation 88% or higher             Nutrition:  Target Goals: Understanding of nutrition guidelines, daily intake of sodium  1500mg , cholesterol 200mg , calories 30% from fat and 7% or less from saturated fats, daily to have 5 or more servings of fruits and vegetables.  Biometrics:  Pre Biometrics - 11/08/22 1443       Pre Biometrics   Height 5' 2.5" (1.588 m)    Weight 188 lb 8 oz (85.5 kg)    Waist Circumference 38.75 inches    Hip Circumference 45 inches    Waist to Hip Ratio 0.86 %    BMI (Calculated) 33.91    Grip Strength 12.8 kg    Single Leg Stand 2.1 seconds             Post Biometrics - 01/14/23 1509        Post  Biometrics   Height 5' 2.5" (1.588 m)    Weight 180 lb 4.8 oz (81.8 kg)    Waist Circumference 34 inches    Hip Circumference 41 inches    Waist to Hip Ratio 0.83 %    BMI (Calculated) 32.43    Grip Strength 18.2 kg    Single Leg Stand 8.6 seconds             Nutrition Therapy Plan and Nutrition Goals:  Nutrition Therapy & Goals - 11/08/22 1453       Nutrition Therapy   Diet Currently follow [place] Diet      Intervention Plan   Intervention Prescribe, educate and counsel regarding individualized specific dietary modifications aiming towards targeted core components such as weight, hypertension, lipid management, diabetes, heart failure and other comorbidities.;Nutrition handout(s) given to patient.    Expected Outcomes Short Term Goal: Understand basic principles of dietary content, such as calories, fat, sodium, cholesterol and nutrients.             Nutrition Assessments:  MEDIFICTS Score Key: >=70 Need to make dietary changes  40-70 Heart Healthy Diet <= 40 Therapeutic Level Cholesterol Diet  Flowsheet Row CARDIAC REHAB PHASE II ORIENTATION from 11/08/2022 in [facility] CARDIAC REHABILITATION  Picture Your Plate Total Score on Admission 66      Picture Your Plate Scores: <16 Unhealthy dietary pattern with much room for improvement. 41-50 Dietary pattern unlikely to meet recommendations for good health and room for improvement. 51-60 More  healthful dietary pattern, with some room for improvement.  >60 Healthy dietary pattern, although there may be some specific behaviors that could be improved.    Nutrition Goals Re-Evaluation:  Nutrition Goals Re-Evaluation     Row Name 12/03/22 1446 12/26/22 1454           Goals   Nutrition Goal healthy eating healthy eating      Comment She is watching what she has been eating in the past month. She has started the [place] diet and her PCP does know. WIth this she is eating more fish, mostly white fish, and chicken. She is also eating more colorfull veggies and fruits. She is eating smaller portions and has been enjoying what she eats. She did state that she has wanted fried chicken really bad and I told her once in a blue moon would be decent to treat herself. She stated that she does not have much of an appite and that it happend every now and then. She will make herself eat a small portion. SHe loves to eat fruit  and will do an ensure during the day. SHe does eat smaller portions and eats chicekn mostly  .      Expected Outcome Short term: try salmon with meals instead of a white fish   long term: continue to eat a healthy diet Short term: cook things she enjoys and wants to eat   long term: continue to eat healhty diet               Nutrition Goals Discharge (Final Nutrition Goals Re-Evaluation):  Nutrition Goals Re-Evaluation - 12/26/22 1454       Goals   Nutrition Goal healthy eating    Comment She stated that she does not have much of an appite and that it happend every now and then. She will make herself eat a small portion. SHe loves to eat fruit and will do an ensure during the day. SHe does eat smaller portions and eats chicekn mostly  .    Expected Outcome Short term: cook things she enjoys and wants to eat   long term: continue to eat healhty diet             Psychosocial: Target Goals: Acknowledge presence or absence of significant depression and/or  stress, maximize coping skills, provide positive support system. Participant is able to verbalize types and ability to use techniques and skills needed for reducing stress and depression.  Initial Review & Psychosocial Screening:  Initial Psych Review & Screening - 11/08/22 1307       Initial Review   Current issues with Current Stress Concerns    Source of Stress Concerns Chronic Illness;Family;Unable to participate in former interests or hobbies;Unable to perform yard/household activities    Comments daughter in [place] (just lost husband), tries not to let things get to her, goes to senior center in [place], would like to get back to volunteering, good sleeper since stomach surgery, tends to forget about herself at times      [facility]   Good Support System? Yes   daughter, friends at [employer], pastor at [employer]   Psychosocial barriers to participate in program The patient should benefit from training in stress management and relaxation.;Psychosocial barriers identified (see note)      Screening Interventions   Interventions Encouraged to exercise;Provide feedback about the scores to participant;To provide support and resources with identified psychosocial needs    Expected Outcomes Short Term goal: Utilizing psychosocial counselor, staff and physician to assist with identification of specific Stressors or current issues interfering with healing process. Setting desired goal for each stressor or current issue identified.;Long Term Goal: Stressors or current issues are controlled or eliminated.;Short Term goal: Identification and review with participant of any Quality of Life or Depression concerns found by scoring the questionnaire.;Long Term goal: The participant improves quality of Life and PHQ9 Scores as seen by post scores and/or verbalization of changes             Quality of Life Scores:  Quality of Life - 11/08/22 1453       Quality of Life   Select Quality of  Life      Quality of Life Scores   Health/Function Pre 28 %    Socioeconomic Pre 30 %    Psych/Spiritual Pre 30 %    Family Pre 30 %    GLOBAL Pre 29.12 %            Scores of 19 and below usually indicate a poorer quality  of life in these areas.  A difference of  2-3 points is a clinically meaningful difference.  A difference of 2-3 points in the total score of the Quality of Life Index has been associated with significant improvement in overall quality of life, self-image, physical symptoms, and general health in studies assessing change in quality of life.  PHQ-9: Review Flowsheet       11/08/2022  Depression screen PHQ 2/9  Decreased Interest 1  Down, Depressed, Hopeless 0  PHQ - 2 Score 1  Altered sleeping 2  Tired, decreased energy 1  Change in appetite 0  Feeling bad or failure about yourself  0  Trouble concentrating 0  Moving slowly or fidgety/restless 0  Suicidal thoughts 0  PHQ-9 Score 4  Difficult doing work/chores Not difficult at all    Details           Interpretation of Total Score  Total Score Depression Severity:  1-4 = Minimal depression, 5-9 = Mild depression, 10-14 = Moderate depression, 15-19 = Moderately severe depression, 20-27 = Severe depression   Psychosocial Evaluation and Intervention:  Psychosocial Evaluation - 11/08/22 1444       Psychosocial Evaluation & Interventions   Interventions Encouraged to exercise with the program and follow exercise prescription    Comments Anika is coming to cardiac rehab after a stent and PTCA.  She is super excited to start rehab and feels that she is going to get a lot out of it.  She has recently lost a lot of weight through diet and exercise on her own.  She wants to get stronger and build her stamina back up.  She also wants to boost her confidence in her heart as well.  She is a very independently woman and was discouraged when she had to go live with her daughter for a while after surgery.  She missed  her independence and is eager to get it all back.  While living with her daughter in [place], her daughter's husband passed, so they were able to be together and lift each other back up.  Estie is very acitve in the [employer] and at the [employer].  She has friends everywhere that look in on her.  She [facility] exercise [facility]. She is planning to use transportation to get to rehab and will get that set up today.  Otherwise, she has no barriers to attending rehab.  She is a happy person and sleeps well most nights.  She has a history of some anxiety but currently doing well.    Expected Outcomes Short: Attend rehab to boost stamina Long; conitnue to exercise for mental boost    Continue Psychosocial Services  Follow up required by staff             Psychosocial Re-Evaluation:  Psychosocial Re-Evaluation     Row Name 12/03/22 1443 12/26/22 1449           Psychosocial Re-Evaluation   Current issues with None Identified;Current Stress Concerns None Identified;Current Stress Concerns      Comments Merrillyn stated that when things start to stress her out she "deletes" them from her life. She stated that her health is more important to her then allowing things to stress her out. Nancylee stated that she still does not have much stress in her life. She said she can not afford to be stressed. Her husbad is in a nursing home but stated that he is doing good. She has a  daughter that helps take her and her husdans to appointments when needed. She is happy.She is happy with how things have been and how exercising and being in the program has helpped her. She wants to continue to be independent so she does not have to move to [facility] with her daughter.      Expected Outcomes Short term: continue to reconize stressors   long term: continue to exericse to improve health and happiness Short term: continue to reconize stressors   long term: continue to exericse to improve health and happiness       Interventions Stress management education;Relaxation education;Encouraged to attend Cardiac Rehabilitation for the exercise Encouraged to attend Cardiac Rehabilitation for the exercise;Stress management education;Relaxation education      Continue Psychosocial Services  Follow up required by staff Follow up required by staff               Psychosocial Discharge (Final Psychosocial Re-Evaluation):  Psychosocial Re-Evaluation - 12/26/22 1449       Psychosocial Re-Evaluation   Current issues with None Identified;Current Stress Concerns    Comments Shatonya stated that she still does not have much stress in her life. She said she can not afford to be stressed. Her husbad is in a nursing home but stated that he is doing good. She has a daughter that helps take her and her husdans to appointments when needed. She is happy.She is happy with how things have been and how exercising and being in the program has helpped her. She wants to continue to be independent so she does not have to move to [facility] with her daughter.    Expected Outcomes Short term: continue to reconize stressors   long term: continue to exericse to improve health and happiness    Interventions Encouraged to attend Cardiac Rehabilitation for the exercise;Stress management education;Relaxation education    Continue Psychosocial Services  Follow up required by staff             Vocational Rehabilitation: Provide vocational rehab assistance to qualifying candidates.   Vocational Rehab Evaluation & Intervention:  Vocational Rehab - 11/08/22 1306       Initial Vocational Rehab Evaluation & Intervention   Assessment shows need for Vocational Rehabilitation No   retired            Education: Education Goals: Education classes will be provided on a weekly basis, covering required topics. Participant will state understanding/return demonstration of topics presented.  Learning Barriers/Preferences:  Learning Barriers/Preferences  - 11/08/22 1305       Learning Barriers/Preferences   Learning Barriers Sight   glasses   Learning Preferences Skilled Demonstration             Education Topics: Hypertension, Hypertension Reduction -Define heart disease and high blood pressure. Discus how high blood pressure affects the body and ways to reduce high blood pressure.   Exercise and Your Heart -Discuss why it is important to exercise, the FITT principles of exercise, normal and abnormal responses to exercise, and how to exercise safely. Flowsheet Row CARDIAC REHAB PHASE II EXERCISE from 01/09/2023 in [place] CARDIAC REHABILITATION  Date 01/02/23  Educator [facility]  Instruction Review Code 2- Demonstrated Understanding       Angina -Discuss definition of angina, causes of angina, treatment of angina, and how to decrease risk of having angina. Flowsheet Row CARDIAC REHAB PHASE II EXERCISE from 01/09/2023 in [place] CARDIAC REHABILITATION  Date 12/26/22  Educator [facility]  Instruction Review Code 2- Demonstrated  Understanding       Cardiac Medications -Review what the following cardiac medications are used for, how they affect the body, and side effects that may occur when taking the medications.  Medications include Aspirin, Beta blockers, calcium channel blockers, ACE Inhibitors, angiotensin receptor blockers, diuretics, digoxin, and antihyperlipidemics. Flowsheet Row CARDIAC REHAB PHASE II EXERCISE from 01/09/2023 in [place] CARDIAC REHABILITATION  Date 12/05/22  Educator DJ  Instruction Review Code 1- Verbalizes Understanding       Congestive Heart Failure -Discuss the definition of CHF, how to live with CHF, the signs and symptoms of CHF, and how keep track of weight and sodium intake. Flowsheet Row CARDIAC REHAB PHASE II EXERCISE from 01/09/2023 in [place] CARDIAC REHABILITATION  Date 12/19/22  Educator [facility]  Instruction Review Code 1- Verbalizes Understanding       Heart Disease and  Intimacy -Discus the effect sexual activity has on the heart, how changes occur during intimacy as we age, and safety during sexual activity. Flowsheet Row CARDIAC REHAB PHASE II EXERCISE from 01/09/2023 in [place] CARDIAC REHABILITATION  Date 11/14/22  Educator [facility]  Instruction Review Code 1- Verbalizes Understanding       Smoking Cessation / COPD -Discuss different methods to quit smoking, the health benefits of quitting smoking, and the definition of COPD. Flowsheet Row CARDIAC REHAB PHASE II EXERCISE from 01/09/2023 in [place] CARDIAC REHABILITATION  Date 12/12/22  Educator [facility]  Instruction Review Code 1- Verbalizes Understanding       Nutrition I: Fats -Discuss the types of cholesterol, what cholesterol does to the heart, and how cholesterol levels can be controlled. Flowsheet Row CARDIAC REHAB PHASE II EXERCISE from 01/09/2023 in [place] CARDIAC REHABILITATION  Date 11/21/22  Educator HB       Nutrition II: Labels -Discuss the different components of food labels and how to read food label Flowsheet Row CARDIAC REHAB PHASE II EXERCISE from 01/09/2023 in [place] CARDIAC REHABILITATION  Date 11/21/22  Educator HB       Heart Parts/Heart Disease and PAD -Discuss the anatomy of the heart, the pathway of blood circulation through the heart, and these are affected by heart disease.   Stress I: Signs and Symptoms -Discuss the causes of stress, how stress may lead to anxiety and depression, and ways to limit stress. Flowsheet Row CARDIAC REHAB PHASE II EXERCISE from 01/09/2023 in [place] CARDIAC REHABILITATION  Date 11/28/22  Educator HB  Instruction Review Code 1- Verbalizes Understanding       Stress II: Relaxation -Discuss different types of relaxation techniques to limit stress. Flowsheet Row CARDIAC REHAB PHASE II EXERCISE from 01/09/2023 in [place] CARDIAC REHABILITATION  Date 11/28/22  Educator HB  Instruction Review Code 1- Verbalizes  Understanding       Warning Signs of Stroke / TIA -Discuss definition of a stroke, what the signs and symptoms are of a stroke, and how to identify when someone is having stroke.   Knowledge Questionnaire Score:  Knowledge Questionnaire Score - 11/08/22 1454       Knowledge Questionnaire Score   Pre Score 18/24             Core Components/Risk Factors/Patient Goals at Admission:  Personal Goals and Risk Factors at Admission - 11/08/22 1454       Core Components/Risk Factors/Patient Goals on Admission    Weight Management Yes;Obesity;Weight Loss    Intervention Weight Management: Develop a combined nutrition and exercise program designed to reach desired caloric intake,  while maintaining appropriate intake of nutrient and fiber, sodium and fats, and appropriate energy expenditure required for the weight goal.;Weight Management: Provide education and appropriate resources to help participant work on and attain dietary goals.;Weight Management/Obesity: Establish reasonable short term and long term weight goals.;Obesity: Provide education and appropriate resources to help participant work on and attain dietary goals.    Admit Weight 188 lb 8 oz (85.5 kg)    Goal Weight: Short Term 183 lb (83 kg)    Goal Weight: Long Term 180 lb (81.6 kg)    Expected Outcomes Short Term: Continue to assess and modify interventions until short term weight is achieved;Long Term: Adherence to nutrition and physical activity/exercise program aimed toward attainment of established weight goal;Weight Loss: Understanding of general recommendations for a balanced deficit meal plan, which promotes 1-2 lb weight loss per week and includes a negative energy balance of [phone] kcal/d;Understanding recommendations for meals to include 15-35% energy as protein, 25-35% energy from fat, 35-60% energy from carbohydrates, less than 200mg  of dietary cholesterol, 20-35 gm of total fiber daily;Understanding of distribution of  calorie intake throughout the day with the consumption of 4-5 meals/snacks    Diabetes Yes    Intervention Provide education about signs/symptoms and action to take for hypo/hyperglycemia.;Provide education about proper nutrition, including hydration, and aerobic/resistive exercise prescription along with prescribed medications to achieve blood glucose in normal ranges: Fasting glucose 65-99 mg/dL    Expected Outcomes Short Term: Participant verbalizes understanding of the signs/symptoms and immediate care of hyper/hypoglycemia, proper foot care and importance of medication, aerobic/resistive exercise and nutrition plan for blood glucose control.;Long Term: Attainment of HbA1C < 7%.    Hypertension Yes    Intervention Provide education on lifestyle modifcations including regular physical activity/exercise, weight management, moderate sodium restriction and increased consumption of fresh fruit, vegetables, and low fat dairy, alcohol moderation, and smoking cessation.;Monitor prescription use compliance.    Expected Outcomes Short Term: Continued assessment and intervention until BP is < 140/39mm HG in hypertensive participants. < 130/46mm HG in hypertensive participants with diabetes, heart failure or chronic kidney disease.;Long Term: Maintenance of blood pressure at goal levels.    Lipids Yes    Intervention Provide education and support for participant on nutrition & aerobic/resistive exercise along with prescribed medications to achieve LDL 70mg , HDL >40mg .    Expected Outcomes Short Term: Participant states understanding of desired cholesterol values and is compliant with medications prescribed. Participant is following exercise prescription and nutrition guidelines.;Long Term: Cholesterol controlled with medications as prescribed, with individualized exercise RX and with personalized nutrition plan. Value goals: LDL < 70mg , HDL > 40 mg.             Core Components/Risk Factors/Patient Goals  Review:   Goals and Risk Factor Review     Row Name 12/03/22 1452 12/26/22 1457           Core Components/Risk Factors/Patient Goals Review   Personal Goals Review Weight Management/Obesity;Diabetes;Hypertension Weight Management/Obesity;Diabetes;Hypertension      Review Nekia has been monitoring her weight, her goal is to lose about 10 more lbs. She checks her CBG everyday and it has been around 125-130. Iley continues to monitor her weight and is happy with how her weight has been. She weighs everyday in the morning. She continues to check her CBG everyday and stated that it stays about 125-130.      Expected Outcomes Short term: exercise and continue to eat healthy for weight and diabetes    long term: continue  to monitor and exercise Short term: exercise and continue to eat healthy for weight and diabetes    long term: continue to monitor and exercise               Core Components/Risk Factors/Patient Goals at Discharge (Final Review):   Goals and Risk Factor Review - 12/26/22 1457       Core Components/Risk Factors/Patient Goals Review   Personal Goals Review Weight Management/Obesity;Diabetes;Hypertension    Review Meha continues to monitor her weight and is happy with how her weight has been. She weighs everyday in the morning. She continues to check her CBG everyday and stated that it stays about 125-130.    Expected Outcomes Short term: exercise and continue to eat healthy for weight and diabetes    long term: continue to monitor and exercise             ITP Comments:  ITP Comments     Row Name 11/08/22 1428 11/12/22 1557 11/21/22 0938 12/19/22 1139 01/16/23 1203   ITP Comments Patient attend orientation today.  Patient is attendingCardiac Rehabilitation Program.  Documentation for diagnosis can be found in [facility] 08/10/22.  Reviewed medical chart, RPE/RPD, gym safety, and program guidelines.  Patient was fitted to equipment they will be using during rehab.  Patient is  scheduled to start exercise on 11/12/22 at 1445.   Initial ITP created and sent for review and signature by Dr. [name], Medical Director for Cardiac Rehabilitation Program. First full day of exercise!  Patient was oriented to gym and equipment including functions, settings, policies, and procedures.  Patient's individual exercise prescription and treatment plan were reviewed.  All starting workloads were established based on the results of the 6 minute walk test done at initial orientation visit.  The plan for exercise progression was also introduced and progression will be customized based on patient's performance and goals. 30 day review completed. ITP sent to Dr. [name], Medical Director of Cardiac Rehab. Continue with ITP unless changes are made by physician. new to program 30 day review completed. ITP sent to Dr. [name], Medical Director of Cardiac Rehab. Continue with ITP unless changes are made by physician. 30 day review completed. ITP sent to Dr. [name], Medical Director of Cardiac Rehab. Continue with ITP unless changes are made by physician.            Comments: 30 day review

## 2023-01-18 ENCOUNTER — Encounter (HOSPITAL_COMMUNITY): Payer: Medicare HMO

## 2023-01-21 ENCOUNTER — Encounter (HOSPITAL_COMMUNITY): Payer: Medicare HMO

## 2023-01-21 ENCOUNTER — Encounter (HOSPITAL_COMMUNITY)
Admission: RE | Admit: 2023-01-21 | Discharge: 2023-01-21 | Disposition: A | Discharge status: Home / Self Care | Payer: Medicare HMO | Source: Ambulatory Visit | Attending: Internal Medicine | Admitting: Internal Medicine

## 2023-01-21 DIAGNOSIS — Z9861 Coronary angioplasty status: Secondary | ICD-10-CM

## 2023-01-21 DIAGNOSIS — Z955 Presence of coronary angioplasty implant and graft: Secondary | ICD-10-CM | POA: Diagnosis not present

## 2023-01-21 NOTE — Progress Notes (Signed)
Daily Session Note  Patient Details  Name: Vanessa [name] MRN: [id number] Date of Birth: 11/17/1950 Referring Provider:   Flowsheet Row CARDIAC REHAB PHASE II ORIENTATION from 11/08/2022 in [facility] CARDIAC REHABILITATION  Referring Provider Cyndia Bent MD  [name], PA]       Encounter Date: 01/21/2023  Check In:  Session Check In - 01/21/23 1430       Check-In   Supervising physician immediately available to respond to emergencies See telemetry face sheet for immediately available MD    Location [facility]    Staff Present Ross Ludwig, BS, Exercise Physiologist;Jessica Juanetta Gosling, MA, RCEP, CCRP, CCET    Virtual Visit No    Medication changes reported     No    Fall or balance concerns reported    No    Tobacco Cessation No Change    Warm-up and Cool-down Performed on first and last piece of equipment    Resistance Training Performed Yes    VAD Patient? No    PAD/SET Patient? No      Pain Assessment   Currently in Pain? No/denies    Multiple Pain Sites No             Capillary Blood Glucose: No results found for this or any previous visit (from the past 24 hour(s)).    Social History   Tobacco Use  Smoking Status Never  Smokeless Tobacco Never    Goals Met:  Independence with exercise equipment Exercise tolerated well No report of concerns or symptoms today Strength training completed today  Goals Unmet:  Not Applicable  Comments: Pt able to follow exercise prescription today without complaint.  Will continue to monitor for progression.

## 2023-01-23 ENCOUNTER — Encounter (HOSPITAL_COMMUNITY): Payer: Medicare HMO

## 2023-01-23 ENCOUNTER — Encounter (HOSPITAL_COMMUNITY)
Admission: RE | Admit: 2023-01-23 | Discharge: 2023-01-23 | Disposition: A | Discharge status: Home / Self Care | Payer: Medicare HMO | Source: Ambulatory Visit | Attending: Internal Medicine | Admitting: Internal Medicine

## 2023-01-23 DIAGNOSIS — Z955 Presence of coronary angioplasty implant and graft: Secondary | ICD-10-CM | POA: Diagnosis not present

## 2023-01-23 DIAGNOSIS — Z9861 Coronary angioplasty status: Secondary | ICD-10-CM

## 2023-01-23 NOTE — Progress Notes (Signed)
Daily Session Note  Patient Details  Name: Vanessa [name] MRN: [id number] Date of Birth: 1950/09/27 Referring Provider:   Flowsheet Row CARDIAC REHAB PHASE II ORIENTATION from 11/08/2022 in [facility] CARDIAC REHABILITATION  Referring Provider Cyndia Bent MD  [name], PA]       Encounter Date: 01/23/2023  Check In:  Session Check In - 01/23/23 1430       Check-In   Supervising physician immediately available to respond to emergencies See telemetry face sheet for immediately available MD    Location [facility]    Staff Present Ross Ludwig, BS, Exercise Physiologist;Jessica Juanetta Gosling, MA, RCEP, CCRP, CCET;Hillary Troutman BSN, RN    Virtual Visit No    Medication changes reported     No    Fall or balance concerns reported    No    Tobacco Cessation No Change    Warm-up and Cool-down Performed on first and last piece of equipment    Resistance Training Performed Yes    VAD Patient? No    PAD/SET Patient? No      Pain Assessment   Currently in Pain? No/denies    Multiple Pain Sites No             Capillary Blood Glucose: No results found for this or any previous visit (from the past 24 hour(s)).    Social History   Tobacco Use  Smoking Status Never  Smokeless Tobacco Never    Goals Met:  Independence with exercise equipment Exercise tolerated well No report of concerns or symptoms today Strength training completed today  Goals Unmet:  Not Applicable  Comments: Pt able to follow exercise prescription today without complaint.  Will continue to monitor for progression.

## 2023-01-25 ENCOUNTER — Encounter (HOSPITAL_COMMUNITY): Payer: Medicare HMO

## 2023-01-28 ENCOUNTER — Encounter (HOSPITAL_COMMUNITY): Payer: Medicare HMO

## 2023-01-28 ENCOUNTER — Encounter (HOSPITAL_COMMUNITY)
Admission: RE | Admit: 2023-01-28 | Discharge: 2023-01-28 | Disposition: A | Discharge status: Home / Self Care | Payer: Medicare HMO | Source: Ambulatory Visit | Attending: Internal Medicine | Admitting: Internal Medicine

## 2023-01-28 DIAGNOSIS — Z955 Presence of coronary angioplasty implant and graft: Secondary | ICD-10-CM | POA: Diagnosis not present

## 2023-01-28 DIAGNOSIS — Z9861 Coronary angioplasty status: Secondary | ICD-10-CM

## 2023-01-28 NOTE — Progress Notes (Signed)
Daily Session Note  Patient Details  Name: Vanessa [name] MRN: [id number] Date of Birth: 1950-08-11 Referring Provider:   Flowsheet Row CARDIAC REHAB PHASE II ORIENTATION from 11/08/2022 in [facility] CARDIAC REHABILITATION  Referring Provider Cyndia Bent MD  [name], PA]       Encounter Date: 01/28/2023  Check In:  Session Check In - 01/28/23 1430       Check-In   Supervising physician immediately available to respond to emergencies See telemetry face sheet for immediately available MD    Location [facility]    Staff Present Ross Ludwig, BS, Exercise Physiologist;Doyce Stonehouse, RN;Jessica Maricao, MA, RCEP, CCRP, CCET    Virtual Visit No    Medication changes reported     No    Fall or balance concerns reported    No    Tobacco Cessation No Change    Warm-up and Cool-down Performed on first and last piece of equipment    Resistance Training Performed Yes    VAD Patient? No    PAD/SET Patient? No      VAD patient   Has back up controller? No      Pain Assessment   Currently in Pain? No/denies    Multiple Pain Sites No             Capillary Blood Glucose: No results found for this or any previous visit (from the past 24 hour(s)).    Social History   Tobacco Use  Smoking Status Never  Smokeless Tobacco Never    Goals Met:  Independence with exercise equipment Exercise tolerated well No report of concerns or symptoms today Strength training completed today  Goals Unmet:  Not Applicable  Comments: Pt able to follow exercise prescription today without complaint.  Will continue to monitor for progression.

## 2023-01-30 ENCOUNTER — Encounter (HOSPITAL_COMMUNITY)
Admission: RE | Admit: 2023-01-30 | Discharge: 2023-01-30 | Disposition: A | Discharge status: Home / Self Care | Payer: Medicare HMO | Source: Ambulatory Visit | Attending: Internal Medicine | Admitting: Internal Medicine

## 2023-01-30 ENCOUNTER — Encounter (HOSPITAL_COMMUNITY): Payer: Medicare HMO

## 2023-01-30 DIAGNOSIS — Z955 Presence of coronary angioplasty implant and graft: Secondary | ICD-10-CM

## 2023-01-30 DIAGNOSIS — Z9861 Coronary angioplasty status: Secondary | ICD-10-CM

## 2023-01-30 NOTE — Progress Notes (Signed)
Daily Session Note  Patient Details  Name: Vanessa [name] MRN: [id number] Date of Birth: 1950/09/13 Referring Provider:   Flowsheet Row CARDIAC REHAB PHASE II ORIENTATION from 11/08/2022 in [facility] CARDIAC REHABILITATION  Referring Provider Cyndia Bent MD  [name], PA]       Encounter Date: 01/30/2023  Check In:  Session Check In - 01/30/23 1400       Check-In   Supervising physician immediately available to respond to emergencies See telemetry face sheet for immediately available MD    Location [facility]    Staff Present Ross Ludwig, BS, Exercise Physiologist;Laporsche Hoeger Leonidas Romberg BSN, RN    Virtual Visit No    Medication changes reported     No    Fall or balance concerns reported    No    Tobacco Cessation No Change    Warm-up and Cool-down Performed on first and last piece of equipment    Resistance Training Performed Yes    VAD Patient? No    PAD/SET Patient? No      Pain Assessment   Currently in Pain? No/denies    Multiple Pain Sites No             Capillary Blood Glucose: No results found for this or any previous visit (from the past 24 hour(s)).    Social History   Tobacco Use  Smoking Status Never  Smokeless Tobacco Never    Goals Met:  Independence with exercise equipment Exercise tolerated well No report of concerns or symptoms today Strength training completed today  Goals Unmet:  Not Applicable  Comments: Marland KitchenMarland KitchenPt able to follow exercise prescription today without complaint.  Will continue to monitor for progression.

## 2023-02-01 ENCOUNTER — Encounter (HOSPITAL_COMMUNITY): Payer: Medicare HMO

## 2023-02-04 ENCOUNTER — Encounter (HOSPITAL_COMMUNITY): Payer: Medicare HMO

## 2023-02-06 ENCOUNTER — Encounter (HOSPITAL_COMMUNITY): Payer: Medicare HMO

## 2023-02-08 ENCOUNTER — Encounter (HOSPITAL_COMMUNITY): Payer: Medicare HMO

## 2023-02-11 ENCOUNTER — Encounter (HOSPITAL_COMMUNITY): Payer: Medicare HMO

## 2023-02-12 NOTE — Progress Notes (Signed)
Discharge Progress Report  Patient Details  Name: Vanessa [name] MRN: [id number] Date of Birth: [birth year] Referring Provider:   Flowsheet Row CARDIAC REHAB PHASE II ORIENTATION from 11/08/2022 in [facility] CARDIAC REHABILITATION  Referring Provider Cyndia Bent MD  [name], [place]]        Number of Visits: 36  Reason for Discharge:  Patient reached a stable level of exercise. Patient independent in their exercise. Patient has met program and personal goals.  Smoking History:  Social History   Tobacco Use  Smoking Status Never  Smokeless Tobacco Never    Diagnosis:  Status post coronary artery stent placement  S/P PTCA (percutaneous transluminal coronary angioplasty)  ADL UCSD:   Initial Exercise Prescription:  Initial Exercise Prescription - 11/08/22 1400       Date of Initial Exercise RX and Referring Provider   Date 11/08/22    Referring Provider Cyndia Bent MD   [name], [place]     Oxygen   Maintain Oxygen Saturation 88% or higher      Treadmill   MPH 2.2    Grade 0.5    Minutes 15    METs 2.84      NuStep   Level 2    SPM 80    Minutes 15    METs 2.5      Prescription Details   Frequency (times per week) 3    Duration Progress to 30 minutes of continuous aerobic without signs/symptoms of physical distress      Intensity   THRR 40-80% of Max Heartrate 100-131    Ratings of Perceived Exertion 11-13    Perceived Dyspnea 0-4      Progression   Progression Continue to progress workloads to maintain intensity without signs/symptoms of physical distress.      Resistance Training   Training Prescription Yes    Weight 3 lb    Reps 10-15             Discharge Exercise Prescription (Final Exercise Prescription Changes):  Exercise Prescription Changes - 01/02/23 1500       Response to Exercise   Blood Pressure (Admit) 124/62    Blood Pressure (Exit) 124/60    Heart Rate (Admit) 70 bpm    Heart Rate (Exercise) 97 bpm    Heart  Rate (Exit) 79 bpm    Rating of Perceived Exertion (Exercise) 13    Duration Continue with 30 min of aerobic exercise without signs/symptoms of physical distress.    Intensity THRR unchanged      Progression   Progression Continue to progress workloads to maintain intensity without signs/symptoms of physical distress.      Resistance Training   Training Prescription Yes    Weight 3lbs    Reps 10-15      Treadmill   MPH 2.1    Grade 0    Minutes 15    METs 2.61      NuStep   Level 3    SPM 89    Minutes 15    METs 2.5      Home Exercise Plan   Plans to continue exercise at Home (comment)    Frequency Add 3 additional days to program exercise sessions.      Oxygen   Maintain Oxygen Saturation 88% or higher             Functional Capacity:  6 Minute Walk     Row Name 11/08/22 1432 01/14/23 1508  6 Minute Walk   Phase Initial Discharge    Distance 1238 feet 1595 feet    Distance % Change -- 28.8 %    Distance Feet Change -- 357 ft    Walk Time 6 minutes 6 minutes    # of Rest Breaks 0 0    MPH 2.34 3.02    METS 2.26 3    RPE 10 11    VO2 Peak 7.92 10.51    Symptoms No No    Resting HR 68 bpm 62 bpm    Resting BP 118/56 126/64    Resting Oxygen Saturation  99 % --    Exercise Oxygen Saturation  during 6 min walk 100 % --    Max Ex. HR 107 bpm 107 bpm    Max Ex. BP 128/64 134/62    2 Minute Post BP 120/64 --             Psychological, QOL, Others - Outcomes: PHQ 2/9:    01/28/2023    3:04 PM 11/08/2022    2:55 PM  Depression screen PHQ 2/9  Decreased Interest 0 1  Down, Depressed, Hopeless 0 0  PHQ - 2 Score 0 1  Altered sleeping 1 2  Tired, decreased energy 2 1  Change in appetite 0 0  Feeling bad or failure about yourself  0 0  Trouble concentrating 0 0  Moving slowly or fidgety/restless 0 0  Suicidal thoughts 0 0  PHQ-9 Score 3 4  Difficult doing work/chores Not difficult at all Not difficult at all    Quality of Life:   Quality of Life - 01/28/23 1515       Quality of Life   Select Quality of Life      Quality of Life Scores   Health/Function Pre 28 %    Health/Function Post 26.25 %    Health/Function % Change -6.25 %    Socioeconomic Pre 30 %    Socioeconomic Post 26 %    Socioeconomic % Change  -13.33 %    Psych/Spiritual Pre 30 %    Psych/Spiritual Post 29.29 %    Psych/Spiritual % Change -2.37 %    Family Pre 30 %    Family Post 28.5 %    Family % Change -5 %    GLOBAL Pre 29.12 %    GLOBAL Post 29.22 %    GLOBAL % Change 0.34 %             Personal Goals: Goals established at orientation with interventions provided to work toward goal.  Personal Goals and Risk Factors at Admission - 11/08/22 1454       Core Components/Risk Factors/Patient Goals on Admission    Weight Management Yes;Obesity;Weight Loss    Intervention Weight Management: Develop a combined nutrition and exercise program designed to reach desired caloric intake, while maintaining appropriate intake of nutrient and fiber, sodium and fats, and appropriate energy expenditure required for the weight goal.;Weight Management: Provide education and appropriate resources to help participant work on and attain dietary goals.;Weight Management/Obesity: Establish reasonable short term and long term weight goals.;Obesity: Provide education and appropriate resources to help participant work on and attain dietary goals.    Admit Weight 188 lb 8 oz (85.5 kg)    Goal Weight: Short Term 183 lb (83 kg)    Goal Weight: Long Term 180 lb (81.6 kg)    Expected Outcomes Short Term: Continue to assess and modify interventions until short term weight is  achieved;Long Term: Adherence to nutrition and physical activity/exercise program aimed toward attainment of established weight goal;Weight Loss: Understanding of general recommendations for a balanced deficit meal plan, which promotes 1-2 lb weight loss per week and includes a negative energy  balance of [phone] kcal/d;Understanding recommendations for meals to include 15-35% energy as protein, 25-35% energy from fat, 35-60% energy from carbohydrates, less than 200mg  of dietary cholesterol, 20-35 gm of total fiber daily;Understanding of distribution of calorie intake throughout the day with the consumption of 4-5 meals/snacks    Diabetes Yes    Intervention Provide education about signs/symptoms and action to take for hypo/hyperglycemia.;Provide education about proper nutrition, including hydration, and aerobic/resistive exercise prescription along with prescribed medications to achieve blood glucose in normal ranges: Fasting glucose 65-99 mg/dL    Expected Outcomes Short Term: Participant verbalizes understanding of the signs/symptoms and immediate care of hyper/hypoglycemia, proper foot care and importance of medication, aerobic/resistive exercise and nutrition plan for blood glucose control.;Long Term: Attainment of HbA1C < 7%.    Hypertension Yes    Intervention Provide education on lifestyle modifcations including regular physical activity/exercise, weight management, moderate sodium restriction and increased consumption of fresh fruit, vegetables, and low fat dairy, alcohol moderation, and smoking cessation.;Monitor prescription use compliance.    Expected Outcomes Short Term: Continued assessment and intervention until BP is < 140/41mm HG in hypertensive participants. < 130/44mm HG in hypertensive participants with diabetes, heart failure or chronic kidney disease.;Long Term: Maintenance of blood pressure at goal levels.    Lipids Yes    Intervention Provide education and support for participant on nutrition & aerobic/resistive exercise along with prescribed medications to achieve LDL 70mg , HDL >40mg .    Expected Outcomes Short Term: Participant states understanding of desired cholesterol values and is compliant with medications prescribed. Participant is following exercise prescription  and nutrition guidelines.;Long Term: Cholesterol controlled with medications as prescribed, with individualized exercise RX and with personalized nutrition plan. Value goals: LDL < 70mg , HDL > 40 mg.              Personal Goals Discharge:  Goals and Risk Factor Review     Row Name 12/03/22 1452 12/26/22 1457           Core Components/Risk Factors/Patient Goals Review   Personal Goals Review Weight Management/Obesity;Diabetes;Hypertension Weight Management/Obesity;Diabetes;Hypertension      Review Punam has been monitoring her weight, her goal is to lose about 10 more lbs. She checks her CBG everyday and it has been around 125-130. Thelma continues to monitor her weight and is happy with how her weight has been. She weighs everyday in the morning. She continues to check her CBG everyday and stated that it stays about 125-130.      Expected Outcomes Short term: exercise and continue to eat healthy for weight and diabetes    long term: continue to monitor and exercise Short term: exercise and continue to eat healthy for weight and diabetes    long term: continue to monitor and exercise               Exercise Goals and Review:  Exercise Goals     Row Name 11/08/22 1440             Exercise Goals   Increase Physical Activity Yes       Intervention Provide advice, education, support and counseling about physical activity/exercise needs.;Develop an individualized exercise prescription for aerobic and resistive training based on initial evaluation findings, risk stratification, comorbidities and participant's personal  goals.       Expected Outcomes Short Term: Attend rehab on a regular basis to increase amount of physical activity.;Long Term: Add in home exercise to make exercise part of routine and to increase amount of physical activity.;Long Term: Exercising regularly at least 3-5 days a week.       Increase Strength and Stamina Yes       Intervention Provide advice, education,  support and counseling about physical activity/exercise needs.;Develop an individualized exercise prescription for aerobic and resistive training based on initial evaluation findings, risk stratification, comorbidities and participant's personal goals.       Expected Outcomes Short Term: Increase workloads from initial exercise prescription for resistance, speed, and METs.;Short Term: Perform resistance training exercises routinely during rehab and add in resistance training at home;Long Term: Improve cardiorespiratory fitness, muscular endurance and strength as measured by increased METs and functional capacity ( )       Able to understand and use rate of perceived exertion (RPE) scale Yes       Intervention Provide education and explanation on how to use RPE scale       Expected Outcomes Short Term: Able to use RPE daily in rehab to express subjective intensity level;Long Term:  Able to use RPE to guide intensity level when exercising independently       Able to understand and use Dyspnea scale Yes       Intervention Provide education and explanation on how to use Dyspnea scale       Expected Outcomes Long Term: Able to use Dyspnea scale to guide intensity level when exercising independently;Short Term: Able to use Dyspnea scale daily in rehab to express subjective sense of shortness of breath during exertion       Knowledge and understanding of Target Heart Rate Range (THRR) Yes       Intervention Provide education and explanation of THRR including how the numbers were predicted and where they are located for reference       Expected Outcomes Short Term: Able to state/look up THRR;Long Term: Able to use THRR to govern intensity when exercising independently;Short Term: Able to use daily as guideline for intensity in rehab       Able to check pulse independently Yes       Intervention Provide education and demonstration on how to check pulse in carotid and radial arteries.;Review the importance of  being able to check your own pulse for safety during independent exercise       Expected Outcomes Short Term: Able to explain why pulse checking is important during independent exercise;Long Term: Able to check pulse independently and accurately       Understanding of Exercise Prescription Yes       Intervention Provide education, explanation, and written materials on patient's individual exercise prescription       Expected Outcomes Short Term: Able to explain program exercise prescription;Long Term: Able to explain home exercise prescription to exercise independently                Exercise Goals Re-Evaluation:  Exercise Goals Re-Evaluation     Row Name 11/08/22 1442 11/22/22 0947 12/03/22 1440 12/24/22 1507       Exercise Goal Re-Evaluation   Exercise Goals Review Able to understand and use Dyspnea scale;Able to understand and use rate of perceived exertion (RPE) scale;Understanding of Exercise Prescription Increase Physical Activity;Understanding of Exercise Prescription;Increase Strength and Stamina Increase Physical Activity;Increase Strength and Stamina;Understanding of Exercise Prescription Increase Physical Activity;Increase Strength and  Stamina;Able to understand and use rate of perceived exertion (RPE) scale;Able to understand and use Dyspnea scale;Knowledge and understanding of Target Heart Rate Range (THRR);Able to check pulse independently;Understanding of Exercise Prescription    Comments Reviewed RPE  and dyspnea scale,and program prescription with pt today.  Pt voiced understanding and was given a copy of goals to take home. Daleen has not increased her worklaods in the past week. She is currently exercising at an RPE of 13 on both sets. Will continue to monitor and progress as able , Aniya has increased her workload on the stepper to level 3 and has been doing great with her SPM. She is able to go out and do everyday activities wothout needing to stop. She stated that she is able  to notice her engery level and knows when she needs tp take breaks. Sharaine has been doing well in rehab.  She has found that coming 3x week is hard for her.  She would like to drop back to twice a week.  Reviewed home exercise with pt today.  Pt plans to walk at home and use staff videos for exercise.  Reviewed THR, pulse, RPE, sign and symptoms, pulse oximetery and when to call 911 or MD.  Also discussed weather considerations and indoor options.  Pt voiced understanding.    Expected Outcomes Short: Use RPE daily to regulate intensity.  Long: Follow program prescription Short term; Increase workload on the stepper in  the next week   long term: continue to attend cardiac rehab. Short term: continue to workloads and increase SPM   long term: continue to attend cardiac rehab Short: Add in exercise at home.  Long: Continue to exercise independently             Nutrition & Weight - Outcomes:  Pre Biometrics - 11/08/22 1443       Pre Biometrics   Height 5' 2.5" (1.588 m)    Weight 85.5 kg    Waist Circumference 38.75 inches    Hip Circumference 45 inches    Waist to Hip Ratio 0.86 %    BMI (Calculated) 33.91    Grip Strength 12.8 kg    Single Leg Stand 2.1 seconds             Post Biometrics - 01/14/23 1509        Post  Biometrics   Height 5' 2.5" (1.588 m)    Weight 81.8 kg    Waist Circumference 34 inches    Hip Circumference 41 inches    Waist to Hip Ratio 0.83 %    BMI (Calculated) 32.43    Grip Strength 18.2 kg    Single Leg Stand 8.6 seconds             Nutrition:  Nutrition Therapy & Goals - 11/08/22 1453       Nutrition Therapy   Diet Currently follow [place] Diet      Intervention Plan   Intervention Prescribe, educate and counsel regarding individualized specific dietary modifications aiming towards targeted core components such as weight, hypertension, lipid management, diabetes, heart failure and other comorbidities.;Nutrition handout(s) given to  patient.    Expected Outcomes Short Term Goal: Understand basic principles of dietary content, such as calories, fat, sodium, cholesterol and nutrients.             Nutrition Discharge:   Education Questionnaire Score:  Knowledge Questionnaire Score - 01/28/23 1504       Knowledge Questionnaire Score   Pre  Score 18/24    Post Score 16/24             Goals reviewed with patient; copy given to patient.

## 2023-02-12 NOTE — Progress Notes (Signed)
Cardiac Individual Treatment Plan  Patient Details  Name: Vanessa [name] MRN: [id number] Date of Birth: 01/19/1951 Referring Provider:   Flowsheet Row CARDIAC REHAB PHASE II ORIENTATION from 11/08/2022 in [facility] CARDIAC REHABILITATION  Referring Provider Cyndia Bent MD  [name], [place]]       Initial Encounter Date:  Flowsheet Row CARDIAC REHAB PHASE II ORIENTATION from 11/08/2022 in [facility] CARDIAC REHABILITATION  Date 11/08/22       Visit Diagnosis: Status post coronary artery stent placement  S/P PTCA (percutaneous transluminal coronary angioplasty)  Patient's Home Medications on Admission:  Current Outpatient Medications:    aspirin EC 81 MG tablet, Take 81 mg by mouth daily., Disp: , Rfl:    brimonidine (ALPHAGAN) 0.2 % ophthalmic solution, Place 1 drop into both eyes 3 (three) times daily., Disp: , Rfl:    carvedilol (COREG) 12.5 MG tablet, Take 12.5 mg by mouth 2 (two) times daily with a meal., Disp: , Rfl:    cholecalciferol (VITAMIN D3) 10 MCG (400 UNIT) TABS tablet, Take 1,000 Units by mouth daily in the afternoon., Disp: , Rfl:    diphenhydrAMINE (BENADRYL) 25 mg capsule, Take 25 mg by mouth every 6 (six) hours as needed for itching., Disp: , Rfl:    dorzolamide-timolol (COSOPT) 22.3-6.8 MG/ML ophthalmic solution, Place 1 drop into both eyes 2 (two) times daily., Disp: , Rfl:    DULoxetine (CYMBALTA) 20 MG capsule, Take 20 mg by mouth daily., Disp: , Rfl:    latanoprost (XALATAN) 0.005 % ophthalmic solution, Place 1 drop into both eyes at bedtime., Disp: , Rfl:    Magnesium Oxide, Antacid, 500 MG CAPS, Take 500 mg by mouth daily., Disp: , Rfl:    metFORMIN (GLUCOPHAGE-XR) 500 MG 24 hr tablet, Take 1,000 mg by mouth 2 (two) times daily., Disp: , Rfl:    Netarsudil Dimesylate 0.02 % SOLN, Apply 1 drop to eye at bedtime. Place 1 drop into the right eye at bedtime, Disp: , Rfl:    omeprazole (PRILOSEC) 10 MG capsule, Take 10 mg by mouth daily., Disp: , Rfl:     rosuvastatin (CRESTOR) 40 MG tablet, Take 40 mg by mouth at bedtime., Disp: , Rfl:    sitaGLIPtin (JANUVIA) 100 MG tablet, Take 100 mg by mouth daily., Disp: , Rfl:    traZODone (DESYREL) 100 MG tablet, Take 100 mg by mouth at bedtime., Disp: , Rfl:    Vitamin D, Ergocalciferol, (DRISDOL) 50000 units CAPS capsule, Take 50,000 Units by mouth every Monday., Disp: , Rfl:   Past Medical History: Past Medical History:  Diagnosis Date   Arthritis    Arthropathy, unspecified, site unspecified    Congestive cardiac failure (HCC)    Coronary artery disease    Heart abnormality    hole in heart and 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 according to pt   Other and unspecified noninfectious gastroenteritis and colitis(558.9)    Other specified disease of pancreas    Personal history of urinary calculi    Stroke [facility])    Nov 2012   Type II or unspecified type diabetes mellitus without mention of complication, not stated as uncontrolled    Unspecified asthma(493.90)    Unspecified essential hypertension     Tobacco Use: Social History   Tobacco Use  Smoking Status Never  Smokeless Tobacco Never    Labs: Review Flowsheet       Latest Ref Rng & Units 07/08/2006 02/20/2011 12/19/2022  Labs for ITP Cardiac and Pulmonary Rehab  Cholestrol 0 - 200  mg/dL - 161  -  LDL (calc) 0 - 99 mg/dL - 096  -  HDL-C >04 mg/dL - 57  -  Trlycerides <540 mg/dL - 94  -  Hemoglobin J8J - 7.3  7.5  7.7        Details       This result is from an external source.         Capillary Blood Glucose: Lab Results  Component Value Date   GLUCAP 156 (H) 11/23/2022   GLUCAP 159 (H) 11/23/2022   GLUCAP 89 11/21/2022   GLUCAP 148 (H) 11/21/2022   GLUCAP 119 (H) 11/19/2022     Exercise Target Goals: Exercise Program Goal: Individual exercise prescription set using results from initial 6 min walk test and THRR while considering  patient's activity barriers and safety.   Exercise Prescription Goal: Starting with aerobic  activity 30 plus minutes a day, 3 days per week for initial exercise prescription. Provide home exercise prescription and guidelines that participant acknowledges understanding prior to discharge.  Activity Barriers & Risk Stratification:  Activity Barriers & Cardiac Risk Stratification - 11/08/22 1305       Activity Barriers & Cardiac Risk Stratification   Activity Barriers Arthritis;Joint Problems;Deconditioning;Muscular Weakness;Balance Concerns    Cardiac Risk Stratification Moderate             6 Minute Walk:  6 Minute Walk     Row Name 11/08/22 1432 01/14/23 1508       6 Minute Walk   Phase Initial Discharge    Distance 1238 feet 1595 feet    Distance % Change -- 28.8 %    Distance Feet Change -- 357 ft    Walk Time 6 minutes 6 minutes    # of Rest Breaks 0 0    MPH 2.34 3.02    METS 2.26 3    RPE 10 11    VO2 Peak 7.92 10.51    Symptoms No No    Resting HR 68 bpm 62 bpm    Resting BP 118/56 126/64    Resting Oxygen Saturation  99 % --    Exercise Oxygen Saturation  during 6 min walk 100 % --    Max Ex. HR 107 bpm 107 bpm    Max Ex. BP 128/64 134/62    2 Minute Post BP 120/64 --             Oxygen Initial Assessment:   Oxygen Re-Evaluation:   Oxygen Discharge (Final Oxygen Re-Evaluation):   Initial Exercise Prescription:  Initial Exercise Prescription - 11/08/22 1400       Date of Initial Exercise RX and Referring Provider   Date 11/08/22    Referring Provider Cyndia Bent MD   [name], [place]     Oxygen   Maintain Oxygen Saturation 88% or higher      Treadmill   MPH 2.2    Grade 0.5    Minutes 15    METs 2.84      NuStep   Level 2    SPM 80    Minutes 15    METs 2.5      Prescription Details   Frequency (times per week) 3    Duration Progress to 30 minutes of continuous aerobic without signs/symptoms of physical distress      Intensity   THRR 40-80% of Max Heartrate 100-131    Ratings of Perceived Exertion 11-13     Perceived Dyspnea 0-4  Progression   Progression Continue to progress workloads to maintain intensity without signs/symptoms of physical distress.      Resistance Training   Training Prescription Yes    Weight 3 lb    Reps 10-15             Perform Capillary Blood Glucose checks as needed.  Exercise Prescription Changes:   Exercise Prescription Changes     Row Name 11/08/22 1400 11/21/22 1500 12/17/22 1500 12/24/22 1500 01/02/23 1500     Response to Exercise   Blood Pressure (Admit) 118/56 148/60 110/60 -- 124/62   Blood Pressure (Exercise) 128/64 -- -- -- --   Blood Pressure (Exit) 120/64 120/50 110/56 -- 124/60   Heart Rate (Admit) 68 bpm 61 bpm 69 bpm -- 70 bpm   Heart Rate (Exercise) 107 bpm 85 bpm 98 bpm -- 97 bpm   Heart Rate (Exit) 70 bpm 64 bpm 75 bpm -- 79 bpm   Oxygen Saturation (Admit) 99 % -- -- -- --   Oxygen Saturation (Exercise) 100 % -- -- -- --   Rating of Perceived Exertion (Exercise) 10 13 13  -- 13   Symptoms none -- -- -- --   Comments walk test results -- -- -- --   Duration -- Continue with 30 min of aerobic exercise without signs/symptoms of physical distress. Continue with 30 min of aerobic exercise without signs/symptoms of physical distress. -- Continue with 30 min of aerobic exercise without signs/symptoms of physical distress.   Intensity -- THRR unchanged THRR unchanged -- THRR unchanged     Progression   Progression -- Continue to progress workloads to maintain intensity without signs/symptoms of physical distress. Continue to progress workloads to maintain intensity without signs/symptoms of physical distress. -- Continue to progress workloads to maintain intensity without signs/symptoms of physical distress.     Resistance Training   Training Prescription -- Yes Yes -- Yes   Weight -- 3 3 -- 3lbs   Reps -- 10-15 10-15 -- 10-15     Treadmill   MPH -- 1.9 2.1 -- 2.1   Grade -- 0 0 -- 0   Minutes -- 15 15 -- 15   METs -- 2.45 2.64 --  2.61     NuStep   Level -- 1 3 -- 3   SPM -- 50 73 -- 89   Minutes -- 15 15 -- 15   METs -- 2 2.3 -- 2.5     Home Exercise Plan   Plans to continue exercise at -- -- -- Home (comment)  walking, staff videos Home (comment)   Frequency -- -- -- Add 3 additional days to program exercise sessions. Add 3 additional days to program exercise sessions.     Oxygen   Maintain Oxygen Saturation -- 88% or higher 88% or higher -- 88% or higher            Exercise Comments:   Exercise Comments     Row Name 11/12/22 1557           Exercise Comments First full day of exercise!  Patient was oriented to gym and equipment including functions, settings, policies, and procedures.  Patient's individual exercise prescription and treatment plan were reviewed.  All starting workloads were established based on the results of the 6 minute walk test done at initial orientation visit.  The plan for exercise progression was also introduced and progression will be customized based on patient's performance and goals.  Exercise Goals and Review:   Exercise Goals     Row Name 11/08/22 1440             Exercise Goals   Increase Physical Activity Yes       Intervention Provide advice, education, support and counseling about physical activity/exercise needs.;Develop an individualized exercise prescription for aerobic and resistive training based on initial evaluation findings, risk stratification, comorbidities and participant's personal goals.       Expected Outcomes Short Term: Attend rehab on a regular basis to increase amount of physical activity.;Long Term: Add in home exercise to make exercise part of routine and to increase amount of physical activity.;Long Term: Exercising regularly at least 3-5 days a week.       Increase Strength and Stamina Yes       Intervention Provide advice, education, support and counseling about physical activity/exercise needs.;Develop an  individualized exercise prescription for aerobic and resistive training based on initial evaluation findings, risk stratification, comorbidities and participant's personal goals.       Expected Outcomes Short Term: Increase workloads from initial exercise prescription for resistance, speed, and METs.;Short Term: Perform resistance training exercises routinely during rehab and add in resistance training at home;Long Term: Improve cardiorespiratory fitness, muscular endurance and strength as measured by increased METs and functional capacity ( )       Able to understand and use rate of perceived exertion (RPE) scale Yes       Intervention Provide education and explanation on how to use RPE scale       Expected Outcomes Short Term: Able to use RPE daily in rehab to express subjective intensity level;Long Term:  Able to use RPE to guide intensity level when exercising independently       Able to understand and use Dyspnea scale Yes       Intervention Provide education and explanation on how to use Dyspnea scale       Expected Outcomes Long Term: Able to use Dyspnea scale to guide intensity level when exercising independently;Short Term: Able to use Dyspnea scale daily in rehab to express subjective sense of shortness of breath during exertion       Knowledge and understanding of Target Heart Rate Range (THRR) Yes       Intervention Provide education and explanation of THRR including how the numbers were predicted and where they are located for reference       Expected Outcomes Short Term: Able to state/look up THRR;Long Term: Able to use THRR to govern intensity when exercising independently;Short Term: Able to use daily as guideline for intensity in rehab       Able to check pulse independently Yes       Intervention Provide education and demonstration on how to check pulse in carotid and radial arteries.;Review the importance of being able to check your own pulse for safety during independent exercise        Expected Outcomes Short Term: Able to explain why pulse checking is important during independent exercise;Long Term: Able to check pulse independently and accurately       Understanding of Exercise Prescription Yes       Intervention Provide education, explanation, and written materials on patient's individual exercise prescription       Expected Outcomes Short Term: Able to explain program exercise prescription;Long Term: Able to explain home exercise prescription to exercise independently                Exercise Goals Re-Evaluation :  Exercise Goals Re-Evaluation     Row Name 11/08/22 1442 11/22/22 0947 12/03/22 1440 12/24/22 1507       Exercise Goal Re-Evaluation   Exercise Goals Review Able to understand and use Dyspnea scale;Able to understand and use rate of perceived exertion (RPE) scale;Understanding of Exercise Prescription Increase Physical Activity;Understanding of Exercise Prescription;Increase Strength and Stamina Increase Physical Activity;Increase Strength and Stamina;Understanding of Exercise Prescription Increase Physical Activity;Increase Strength and Stamina;Able to understand and use rate of perceived exertion (RPE) scale;Able to understand and use Dyspnea scale;Knowledge and understanding of Target Heart Rate Range (THRR);Able to check pulse independently;Understanding of Exercise Prescription    Comments Reviewed RPE  and dyspnea scale,and program prescription with pt today.  Pt voiced understanding and was given a copy of goals to take home. Vanessa [name] has not increased her worklaods in the past week. She is currently exercising at an RPE of 13 on both sets. Will continue to monitor and progress as able , Vanessa [name] has increased her workload on the stepper to level 3 and has been doing great with her SPM. She is able to go out and do everyday activities wothout needing to stop. She stated that she is able to notice her engery level and knows when she needs tp take breaks. Vanessa [name] has  been doing well in rehab.  She has found that coming 3x week is hard for her.  She would like to drop back to twice a week.  Reviewed home exercise with pt today.  Pt plans to walk at home and use staff videos for exercise.  Reviewed THR, pulse, RPE, sign and symptoms, pulse oximetery and when to call 911 or MD.  Also discussed weather considerations and indoor options.  Pt voiced understanding.    Expected Outcomes Short: Use RPE daily to regulate intensity.  Long: Follow program prescription Short term; Increase workload on the stepper in  the next week   long term: continue to attend cardiac rehab. Short term: continue to workloads and increase SPM   long term: continue to attend cardiac rehab Short: Add in exercise at home.  Long: Continue to exercise independently              Discharge Exercise Prescription (Final Exercise Prescription Changes):  Exercise Prescription Changes - 01/02/23 1500       Response to Exercise   Blood Pressure (Admit) 124/62    Blood Pressure (Exit) 124/60    Heart Rate (Admit) 70 bpm    Heart Rate (Exercise) 97 bpm    Heart Rate (Exit) 79 bpm    Rating of Perceived Exertion (Exercise) 13    Duration Continue with 30 min of aerobic exercise without signs/symptoms of physical distress.    Intensity THRR unchanged      Progression   Progression Continue to progress workloads to maintain intensity without signs/symptoms of physical distress.      Resistance Training   Training Prescription Yes    Weight 3lbs    Reps 10-15      Treadmill   MPH 2.1    Grade 0    Minutes 15    METs 2.61      NuStep   Level 3    SPM 89    Minutes 15    METs 2.5      Home Exercise Plan   Plans to continue exercise at Home (comment)    Frequency Add 3 additional days to program exercise sessions.      Oxygen   Maintain  Oxygen Saturation 88% or higher             Nutrition:  Target Goals: Understanding of nutrition guidelines, daily intake of sodium  1500mg , cholesterol 200mg , calories 30% from fat and 7% or less from saturated fats, daily to have 5 or more servings of fruits and vegetables.  Biometrics:  Pre Biometrics - 11/08/22 1443       Pre Biometrics   Height 5' 2.5" (1.588 m)    Weight 85.5 kg    Waist Circumference 38.75 inches    Hip Circumference 45 inches    Waist to Hip Ratio 0.86 %    BMI (Calculated) 33.91    Grip Strength 12.8 kg    Single Leg Stand 2.1 seconds             Post Biometrics - 01/14/23 1509        Post  Biometrics   Height 5' 2.5" (1.588 m)    Weight 81.8 kg    Waist Circumference 34 inches    Hip Circumference 41 inches    Waist to Hip Ratio 0.83 %    BMI (Calculated) 32.43    Grip Strength 18.2 kg    Single Leg Stand 8.6 seconds             Nutrition Therapy Plan and Nutrition Goals:  Nutrition Therapy & Goals - 11/08/22 1453       Nutrition Therapy   Diet Currently follow [place] Diet      Intervention Plan   Intervention Prescribe, educate and counsel regarding individualized specific dietary modifications aiming towards targeted core components such as weight, hypertension, lipid management, diabetes, heart failure and other comorbidities.;Nutrition handout(s) given to patient.    Expected Outcomes Short Term Goal: Understand basic principles of dietary content, such as calories, fat, sodium, cholesterol and nutrients.             Nutrition Assessments:  MEDIFICTS Score Key: >=70 Need to make dietary changes  40-70 Heart Healthy Diet <= 40 Therapeutic Level Cholesterol Diet  Flowsheet Row CARDIAC REHAB PHASE II EXERCISE from 01/28/2023 in [facility] CARDIAC REHABILITATION  Picture Your Plate Total Score on Admission 66  Picture Your Plate Total Score on Discharge 66      Picture Your Plate Scores: <16 Unhealthy dietary pattern with much room for improvement. 41-50 Dietary pattern unlikely to meet recommendations for good health and room for  improvement. 51-60 More healthful dietary pattern, with some room for improvement.  >60 Healthy dietary pattern, although there may be some specific behaviors that could be improved.    Nutrition Goals Re-Evaluation:  Nutrition Goals Re-Evaluation     Row Name 12/03/22 1446 12/26/22 1454           Goals   Nutrition Goal healthy eating healthy eating      Comment She is watching what she has been eating in the past month. She has started the [place] diet and her PCP does know. WIth this she is eating more fish, mostly white fish, and chicken. She is also eating more colorfull veggies and fruits. She is eating smaller portions and has been enjoying what she eats. She did state that she has wanted fried chicken really bad and I told her once in a blue moon would be decent to treat herself. She stated that she does not have much of an appite and that it happend every now and then. She will make herself eat a small portion. SHe loves to eat  fruit and will do an ensure during the day. SHe does eat smaller portions and eats chicekn mostly  .      Expected Outcome Short term: try salmon with meals instead of a white fish   long term: continue to eat a healthy diet Short term: cook things she enjoys and wants to eat   long term: continue to eat healhty diet               Nutrition Goals Discharge (Final Nutrition Goals Re-Evaluation):  Nutrition Goals Re-Evaluation - 12/26/22 1454       Goals   Nutrition Goal healthy eating    Comment She stated that she does not have much of an appite and that it happend every now and then. She will make herself eat a small portion. SHe loves to eat fruit and will do an ensure during the day. SHe does eat smaller portions and eats chicekn mostly  .    Expected Outcome Short term: cook things she enjoys and wants to eat   long term: continue to eat healhty diet             Psychosocial: Target Goals: Acknowledge presence or absence of significant  depression and/or stress, maximize coping skills, provide positive support system. Participant is able to verbalize types and ability to use techniques and skills needed for reducing stress and depression.  Initial Review & Psychosocial Screening:  Initial Psych Review & Screening - 11/08/22 1307       Initial Review   Current issues with Current Stress Concerns    Source of Stress Concerns Chronic Illness;Family;Unable to participate in former interests or hobbies;Unable to perform yard/household activities    Comments daughter in [place] (just lost husband), tries not to let things get to her, goes to senior center in [place], would like to get back to volunteering, good sleeper since stomach surgery, tends to forget about herself at times      [facility]   Good Support System? Yes   daughter, friends at [employer], pastor at [employer]   Psychosocial barriers to participate in program The patient should benefit from training in stress management and relaxation.;Psychosocial barriers identified (see note)      Screening Interventions   Interventions Encouraged to exercise;Provide feedback about the scores to participant;To provide support and resources with identified psychosocial needs    Expected Outcomes Short Term goal: Utilizing psychosocial counselor, staff and physician to assist with identification of specific Stressors or current issues interfering with healing process. Setting desired goal for each stressor or current issue identified.;Long Term Goal: Stressors or current issues are controlled or eliminated.;Short Term goal: Identification and review with participant of any Quality of Life or Depression concerns found by scoring the questionnaire.;Long Term goal: The participant improves quality of Life and PHQ9 Scores as seen by post scores and/or verbalization of changes             Quality of Life Scores:  Quality of Life - 01/28/23 1515       Quality of Life    Select Quality of Life      Quality of Life Scores   Health/Function Pre 28 %    Health/Function Post 26.25 %    Health/Function % Change -6.25 %    Socioeconomic Pre 30 %    Socioeconomic Post 26 %    Socioeconomic % Change  -13.33 %    Psych/Spiritual Pre 30 %  Psych/Spiritual Post 29.29 %    Psych/Spiritual % Change -2.37 %    Family Pre 30 %    Family Post 28.5 %    Family % Change -5 %    GLOBAL Pre 29.12 %    GLOBAL Post 29.22 %    GLOBAL % Change 0.34 %            Scores of 19 and below usually indicate a poorer quality of life in these areas.  A difference of  2-3 points is a clinically meaningful difference.  A difference of 2-3 points in the total score of the Quality of Life Index has been associated with significant improvement in overall quality of life, self-image, physical symptoms, and general health in studies assessing change in quality of life.  PHQ-9: Review Flowsheet       01/28/2023 11/08/2022  Depression screen PHQ 2/9  Decreased Interest 0 1  Down, Depressed, Hopeless 0 0  PHQ - 2 Score 0 1  Altered sleeping 1 2  Tired, decreased energy 2 1  Change in appetite 0 0  Feeling bad or failure about yourself  0 0  Trouble concentrating 0 0  Moving slowly or fidgety/restless 0 0  Suicidal thoughts 0 0  PHQ-9 Score 3 4  Difficult doing work/chores Not difficult at all Not difficult at all    Details           Interpretation of Total Score  Total Score Depression Severity:  1-4 = Minimal depression, 5-9 = Mild depression, 10-14 = Moderate depression, 15-19 = Moderately severe depression, 20-27 = Severe depression   Psychosocial Evaluation and Intervention:  Psychosocial Evaluation - 11/08/22 1444       Psychosocial Evaluation & Interventions   Interventions Encouraged to exercise with the program and follow exercise prescription    Comments Vanessa [name] is coming to cardiac rehab after a stent and PTCA.  She is super excited to start rehab and feels  that she is going to get a lot out of it.  She has recently lost a lot of weight through diet and exercise on her own.  She wants to get stronger and build her stamina back up.  She also wants to boost her confidence in her heart as well.  She is a very independently woman and was discouraged when she had to go live with her daughter for a while after surgery.  She missed her independence and is eager to get it all back.  While living with her daughter in [place], her daughter's husband passed, so they were able to be together and lift each other back up.  Vanessa [name] is very acitve in the [employer] and at the [employer].  She has friends everywhere that look in on her.  She [facility] classes at [facility]. She is planning to use transportation to get to rehab and will get that set up today.  Otherwise, she has no barriers to attending rehab.  She is a happy person and sleeps well most nights.  She has a history of some anxiety but currently doing well.    Expected Outcomes Short: Attend rehab to boost stamina Long; conitnue to exercise for mental boost    Continue Psychosocial Services  Follow up required by staff             Psychosocial Re-Evaluation:  Psychosocial Re-Evaluation     Row Name 12/03/22 1443 12/26/22 1449  Psychosocial Re-Evaluation   Current issues with None Identified;Current Stress Concerns None Identified;Current Stress Concerns      Comments Vanessa [name] stated that when things start to stress her out she "deletes" them from her life. She stated that her health is more important to her then allowing things to stress her out. Hind stated that she still does not have much stress in her life. She said she can not afford to be stressed. Her husbad is in a nursing home but stated that he is doing good. She has a daughter that helps take her and her husdans to appointments when needed. She is happy.She is happy with how things have been and how exercising and being in the  program has helpped her. She wants to continue to be independent so she does not have to move to [facility] with her daughter.      Expected Outcomes Short term: continue to reconize stressors   long term: continue to exericse to improve health and happiness Short term: continue to reconize stressors   long term: continue to exericse to improve health and happiness      Interventions Stress management education;Relaxation education;Encouraged to attend Cardiac Rehabilitation for the exercise Encouraged to attend Cardiac Rehabilitation for the exercise;Stress management education;Relaxation education      Continue Psychosocial Services  Follow up required by staff Follow up required by staff               Psychosocial Discharge (Final Psychosocial Re-Evaluation):  Psychosocial Re-Evaluation - 12/26/22 1449       Psychosocial Re-Evaluation   Current issues with None Identified;Current Stress Concerns    Comments Vanessa [name] stated that she still does not have much stress in her life. She said she can not afford to be stressed. Her husbad is in a nursing home but stated that he is doing good. She has a daughter that helps take her and her husdans to appointments when needed. She is happy.She is happy with how things have been and how exercising and being in the program has helpped her. She wants to continue to be independent so she does not have to move to [facility] with her daughter.    Expected Outcomes Short term: continue to reconize stressors   long term: continue to exericse to improve health and happiness    Interventions Encouraged to attend Cardiac Rehabilitation for the exercise;Stress management education;Relaxation education    Continue Psychosocial Services  Follow up required by staff             Vocational Rehabilitation: Provide vocational rehab assistance to qualifying candidates.   Vocational Rehab Evaluation & Intervention:  Vocational Rehab - 11/08/22 1306       Initial Vocational Rehab  Evaluation & Intervention   Assessment shows need for Vocational Rehabilitation No   retired            Education: Education Goals: Education classes will be provided on a weekly basis, covering required topics. Participant will state understanding/return demonstration of topics presented.  Learning Barriers/Preferences:  Learning Barriers/Preferences - 11/08/22 1305       Learning Barriers/Preferences   Learning Barriers Sight   glasses   Learning Preferences Skilled Demonstration             Education Topics: Hypertension, Hypertension Reduction -Define heart disease and high blood pressure. Discus how high blood pressure affects the body and ways to reduce high blood pressure.   Exercise and Your Heart -Discuss why it is important to exercise, the  FITT principles of exercise, normal and abnormal responses to exercise, and how to exercise safely. Flowsheet Row CARDIAC REHAB PHASE II EXERCISE from 01/30/2023 in [place] CARDIAC REHABILITATION  Date 01/02/23  Educator [facility]  Instruction Review Code 2- Demonstrated Understanding       Angina -Discuss definition of angina, causes of angina, treatment of angina, and how to decrease risk of having angina. Flowsheet Row CARDIAC REHAB PHASE II EXERCISE from 01/30/2023 in [place] CARDIAC REHABILITATION  Date 12/26/22  Educator [facility]  Instruction Review Code 2- Demonstrated Understanding       Cardiac Medications -Review what the following cardiac medications are used for, how they affect the body, and side effects that may occur when taking the medications.  Medications include Aspirin, Beta blockers, calcium channel blockers, ACE Inhibitors, angiotensin receptor blockers, diuretics, digoxin, and antihyperlipidemics. Flowsheet Row CARDIAC REHAB PHASE II EXERCISE from 01/30/2023 in [place] CARDIAC REHABILITATION  Date 12/05/22  Educator DJ  Instruction Review Code 1- Verbalizes Understanding       Congestive  Heart Failure -Discuss the definition of CHF, how to live with CHF, the signs and symptoms of CHF, and how keep track of weight and sodium intake. Flowsheet Row CARDIAC REHAB PHASE II EXERCISE from 01/30/2023 in [place] CARDIAC REHABILITATION  Date 12/19/22  Educator [facility]  Instruction Review Code 1- Verbalizes Understanding       Heart Disease and Intimacy -Discus the effect sexual activity has on the heart, how changes occur during intimacy as we age, and safety during sexual activity. Flowsheet Row CARDIAC REHAB PHASE II EXERCISE from 01/30/2023 in [place] CARDIAC REHABILITATION  Date 11/14/22  Educator [facility]  Instruction Review Code 1- Verbalizes Understanding       Smoking Cessation / COPD -Discuss different methods to quit smoking, the health benefits of quitting smoking, and the definition of COPD. Flowsheet Row CARDIAC REHAB PHASE II EXERCISE from 01/30/2023 in [place] CARDIAC REHABILITATION  Date 12/12/22  Educator [facility]  Instruction Review Code 1- Verbalizes Understanding       Nutrition I: Fats -Discuss the types of cholesterol, what cholesterol does to the heart, and how cholesterol levels can be controlled. Flowsheet Row CARDIAC REHAB PHASE II EXERCISE from 01/30/2023 in [place] CARDIAC REHABILITATION  Date 11/21/22  Educator HB       Nutrition II: Labels -Discuss the different components of food labels and how to read food label Flowsheet Row CARDIAC REHAB PHASE II EXERCISE from 01/30/2023 in [place] CARDIAC REHABILITATION  Date 11/21/22  Educator HB       Heart Parts/Heart Disease and PAD -Discuss the anatomy of the heart, the pathway of blood circulation through the heart, and these are affected by heart disease.   Stress I: Signs and Symptoms -Discuss the causes of stress, how stress may lead to anxiety and depression, and ways to limit stress. Flowsheet Row CARDIAC REHAB PHASE II EXERCISE from 01/30/2023 in [place] CARDIAC  REHABILITATION  Date 11/28/22  Educator HB  Instruction Review Code 1- Verbalizes Understanding       Stress II: Relaxation -Discuss different types of relaxation techniques to limit stress. Flowsheet Row CARDIAC REHAB PHASE II EXERCISE from 01/30/2023 in [place] CARDIAC REHABILITATION  Date 11/28/22  Educator HB  Instruction Review Code 1- Verbalizes Understanding       Warning Signs of Stroke / TIA -Discuss definition of a stroke, what the signs and symptoms are of a stroke, and how to identify when someone is having stroke.  Knowledge Questionnaire Score:  Knowledge Questionnaire Score - 01/28/23 1504       Knowledge Questionnaire Score   Pre Score 18/24    Post Score 16/24             Core Components/Risk Factors/Patient Goals at Admission:  Personal Goals and Risk Factors at Admission - 11/08/22 1454       Core Components/Risk Factors/Patient Goals on Admission    Weight Management Yes;Obesity;Weight Loss    Intervention Weight Management: Develop a combined nutrition and exercise program designed to reach desired caloric intake, while maintaining appropriate intake of nutrient and fiber, sodium and fats, and appropriate energy expenditure required for the weight goal.;Weight Management: Provide education and appropriate resources to help participant work on and attain dietary goals.;Weight Management/Obesity: Establish reasonable short term and long term weight goals.;Obesity: Provide education and appropriate resources to help participant work on and attain dietary goals.    Admit Weight 188 lb 8 oz (85.5 kg)    Goal Weight: Short Term 183 lb (83 kg)    Goal Weight: Long Term 180 lb (81.6 kg)    Expected Outcomes Short Term: Continue to assess and modify interventions until short term weight is achieved;Long Term: Adherence to nutrition and physical activity/exercise program aimed toward attainment of established weight goal;Weight Loss: Understanding of  general recommendations for a balanced deficit meal plan, which promotes 1-2 lb weight loss per week and includes a negative energy balance of [phone] kcal/d;Understanding recommendations for meals to include 15-35% energy as protein, 25-35% energy from fat, 35-60% energy from carbohydrates, less than 200mg  of dietary cholesterol, 20-35 gm of total fiber daily;Understanding of distribution of calorie intake throughout the day with the consumption of 4-5 meals/snacks    Diabetes Yes    Intervention Provide education about signs/symptoms and action to take for hypo/hyperglycemia.;Provide education about proper nutrition, including hydration, and aerobic/resistive exercise prescription along with prescribed medications to achieve blood glucose in normal ranges: Fasting glucose 65-99 mg/dL    Expected Outcomes Short Term: Participant verbalizes understanding of the signs/symptoms and immediate care of hyper/hypoglycemia, proper foot care and importance of medication, aerobic/resistive exercise and nutrition plan for blood glucose control.;Long Term: Attainment of HbA1C < 7%.    Hypertension Yes    Intervention Provide education on lifestyle modifcations including regular physical activity/exercise, weight management, moderate sodium restriction and increased consumption of fresh fruit, vegetables, and low fat dairy, alcohol moderation, and smoking cessation.;Monitor prescription use compliance.    Expected Outcomes Short Term: Continued assessment and intervention until BP is < 140/67mm HG in hypertensive participants. < 130/75mm HG in hypertensive participants with diabetes, heart failure or chronic kidney disease.;Long Term: Maintenance of blood pressure at goal levels.    Lipids Yes    Intervention Provide education and support for participant on nutrition & aerobic/resistive exercise along with prescribed medications to achieve LDL 70mg , HDL >40mg .    Expected Outcomes Short Term: Participant states  understanding of desired cholesterol values and is compliant with medications prescribed. Participant is following exercise prescription and nutrition guidelines.;Long Term: Cholesterol controlled with medications as prescribed, with individualized exercise RX and with personalized nutrition plan. Value goals: LDL < 70mg , HDL > 40 mg.             Core Components/Risk Factors/Patient Goals Review:   Goals and Risk Factor Review     Row Name 12/03/22 1452 12/26/22 1457           Core Components/Risk Factors/Patient Goals Review   Personal Goals  Review Weight Management/Obesity;Diabetes;Hypertension Weight Management/Obesity;Diabetes;Hypertension      Review Vanessa [name] has been monitoring her weight, her goal is to lose about 10 more lbs. She checks her CBG everyday and it has been around 125-130. Vanessa [name] continues to monitor her weight and is happy with how her weight has been. She weighs everyday in the morning. She continues to check her CBG everyday and stated that it stays about 125-130.      Expected Outcomes Short term: exercise and continue to eat healthy for weight and diabetes    long term: continue to monitor and exercise Short term: exercise and continue to eat healthy for weight and diabetes    long term: continue to monitor and exercise               Core Components/Risk Factors/Patient Goals at Discharge (Final Review):   Goals and Risk Factor Review - 12/26/22 1457       Core Components/Risk Factors/Patient Goals Review   Personal Goals Review Weight Management/Obesity;Diabetes;Hypertension    Review Vanessa [name] continues to monitor her weight and is happy with how her weight has been. She weighs everyday in the morning. She continues to check her CBG everyday and stated that it stays about 125-130.    Expected Outcomes Short term: exercise and continue to eat healthy for weight and diabetes    long term: continue to monitor and exercise             ITP Comments:  ITP Comments      Row Name 11/08/22 1428 11/12/22 1557 11/21/22 0938 12/19/22 1139 01/16/23 1203   ITP Comments Patient attend orientation today.  Patient is attendingCardiac Rehabilitation Program.  Documentation for diagnosis can be found in [facility] 08/10/22.  Reviewed medical chart, RPE/RPD, gym safety, and program guidelines.  Patient was fitted to equipment they will be using during rehab.  Patient is scheduled to start exercise on 11/12/22 at 1445.   Initial ITP created and sent for review and signature by Dr. [name], Medical Director for Cardiac Rehabilitation Program. First full day of exercise!  Patient was oriented to gym and equipment including functions, settings, policies, and procedures.  Patient's individual exercise prescription and treatment plan were reviewed.  All starting workloads were established based on the results of the 6 minute walk test done at initial orientation visit.  The plan for exercise progression was also introduced and progression will be customized based on patient's performance and goals. 30 day review completed. ITP sent to Dr. [name], Medical Director of Cardiac Rehab. Continue with ITP unless changes are made by physician. new to program 30 day review completed. ITP sent to Dr. [name], Medical Director of Cardiac Rehab. Continue with ITP unless changes are made by physician. 30 day review completed. ITP sent to Dr. [name], Medical Director of Cardiac Rehab. Continue with ITP unless changes are made by physician.            Comments: Discharge ITP

## 2023-02-13 ENCOUNTER — Encounter (HOSPITAL_COMMUNITY): Payer: Medicare HMO

## 2023-02-26 ENCOUNTER — Other Ambulatory Visit (HOSPITAL_COMMUNITY): Payer: Self-pay | Admitting: Internal Medicine

## 2023-02-26 DIAGNOSIS — Z1231 Encounter for screening mammogram for malignant neoplasm of breast: Secondary | ICD-10-CM
